# Patient Record
Sex: Male | Born: 1945
Health system: Southern US, Community
[De-identification: ages and names within clinical notes are randomized; demographics above are authoritative.]

## PROBLEM LIST (undated history)

## (undated) DIAGNOSIS — L309 Dermatitis, unspecified: Secondary | ICD-10-CM

## (undated) DIAGNOSIS — D494 Neoplasm of unspecified behavior of bladder: Secondary | ICD-10-CM

## (undated) DIAGNOSIS — K219 Gastro-esophageal reflux disease without esophagitis: Secondary | ICD-10-CM

## (undated) DIAGNOSIS — F411 Generalized anxiety disorder: Secondary | ICD-10-CM

## (undated) DIAGNOSIS — N35919 Unspecified urethral stricture, male, unspecified site: Secondary | ICD-10-CM

## (undated) DIAGNOSIS — Z8659 Personal history of other mental and behavioral disorders: Secondary | ICD-10-CM

## (undated) DIAGNOSIS — F419 Anxiety disorder, unspecified: Secondary | ICD-10-CM

## (undated) DIAGNOSIS — Z8551 Personal history of malignant neoplasm of bladder: Secondary | ICD-10-CM

## (undated) DIAGNOSIS — Z872 Personal history of diseases of the skin and subcutaneous tissue: Secondary | ICD-10-CM

## (undated) HISTORY — PX: TRANSURETHRAL RESECTION OF BLADDER TUMOR: SHX2575

## (undated) HISTORY — PX: CATARACT EXTRACTION W/ INTRAOCULAR LENS  IMPLANT, BILATERAL: SHX1307

---

## 1991-04-28 HISTORY — PX: APPENDECTOMY: SHX54

## 2016-11-12 ENCOUNTER — Emergency Department (HOSPITAL_COMMUNITY)
Admission: EM | Admit: 2016-11-12 | Discharge: 2016-11-12 | Disposition: A | Discharge status: Home / Self Care | Payer: Medicaid Other | Attending: Emergency Medicine | Admitting: Emergency Medicine

## 2016-11-12 ENCOUNTER — Emergency Department (HOSPITAL_COMMUNITY): Payer: Medicaid Other

## 2016-11-12 DIAGNOSIS — N3289 Other specified disorders of bladder: Secondary | ICD-10-CM | POA: Diagnosis not present

## 2016-11-12 DIAGNOSIS — R1030 Lower abdominal pain, unspecified: Secondary | ICD-10-CM | POA: Diagnosis present

## 2016-11-12 DIAGNOSIS — R109 Unspecified abdominal pain: Secondary | ICD-10-CM

## 2016-11-12 LAB — URINALYSIS, ROUTINE W REFLEX MICROSCOPIC
Bilirubin Urine: NEGATIVE
GLUCOSE, UA: NEGATIVE mg/dL
KETONES UR: NEGATIVE mg/dL
LEUKOCYTES UA: NEGATIVE
Nitrite: NEGATIVE
PH: 7 (ref 5.0–8.0)
PROTEIN: NEGATIVE mg/dL
Specific Gravity, Urine: 1.004 — ABNORMAL LOW (ref 1.005–1.030)

## 2016-11-12 LAB — COMPREHENSIVE METABOLIC PANEL
ALK PHOS: 78 U/L (ref 38–126)
ALT: 23 U/L (ref 17–63)
AST: 22 U/L (ref 15–41)
Albumin: 4.6 g/dL (ref 3.5–5.0)
Anion gap: 7 (ref 5–15)
BILIRUBIN TOTAL: 0.8 mg/dL (ref 0.3–1.2)
BUN: 17 mg/dL (ref 6–20)
CALCIUM: 9.2 mg/dL (ref 8.9–10.3)
CO2: 28 mmol/L (ref 22–32)
CREATININE: 0.86 mg/dL (ref 0.61–1.24)
Chloride: 103 mmol/L (ref 101–111)
Glucose, Bld: 102 mg/dL — ABNORMAL HIGH (ref 65–99)
Potassium: 4.1 mmol/L (ref 3.5–5.1)
Sodium: 138 mmol/L (ref 135–145)
TOTAL PROTEIN: 7.4 g/dL (ref 6.5–8.1)

## 2016-11-12 LAB — CBC
HCT: 44.5 % (ref 39.0–52.0)
Hemoglobin: 15.3 g/dL (ref 13.0–17.0)
MCH: 29 pg (ref 26.0–34.0)
MCHC: 34.4 g/dL (ref 30.0–36.0)
MCV: 84.4 fL (ref 78.0–100.0)
PLATELETS: 238 10*3/uL (ref 150–400)
RBC: 5.27 MIL/uL (ref 4.22–5.81)
RDW: 12.8 % (ref 11.5–15.5)
WBC: 6.9 10*3/uL (ref 4.0–10.5)

## 2016-11-12 MED ORDER — IOPAMIDOL (ISOVUE-300) INJECTION 61%
INTRAVENOUS | Status: AC
  Administered 2016-11-12: 100 mL via INTRAVENOUS
  Filled 2016-11-12: qty 100

## 2016-11-12 MED ORDER — IOPAMIDOL (ISOVUE-300) INJECTION 61%
100.0000 mL | Freq: Once | INTRAVENOUS | Status: AC | PRN
  Administered 2016-11-12: 100 mL via INTRAVENOUS

## 2016-11-12 NOTE — ED Notes (Signed)
Bed: [facility]PT1 Expected date:  Expected time:  Means of arrival:  Comments: 

## 2016-11-12 NOTE — Discharge Instructions (Signed)
Follow up with urology

## 2016-11-12 NOTE — ED Notes (Signed)
Pt had drawn In triage: Blue Gold Lavender Lt green  Dark green

## 2016-11-12 NOTE — ED Notes (Signed)
Pt refused foley catheter, pt able to urine

## 2016-11-12 NOTE — ED Provider Notes (Signed)
[facility] DEPT Provider Note   CSN: [id number] Arrival date & time: 11/12/16  1355     History   Chief Complaint Chief Complaint  Patient presents with  . Abdominal Pain   Patient is not speaking primarily of translated by his daughter. Medical translator offered and refused by family. HPI Ralph Rodgers is a 71 y.o. male.  HPI Patient presents with Lower abdominal pain for the last few days. Some urinary hesitancy. No fevers. No hematuria. Still able to urinate. Getting up frequently at night. Had a bladder tumor removed in 2006. Reportedly it was a mass. Family states it wasn't cancerous but he [place]. [place] States she had a follow-up around 8 months ago. There were unable to do a cystoscopy but reportedly had an ultrasound that did not show a mass. No diarrhea or constipation. No unintentional weight loss. Does not have a urologist here. No past medical history on file.  There are no active problems to display for this patient.   No past surgical history on file.     Home Medications    Prior to Admission medications   Medication Sig Start Date End Date Taking? Authorizing Provider  acetaminophen (TYLENOL) 500 MG tablet Take 500 mg by mouth every 6 (six) hours as needed for mild pain.   Yes [provider]  ALPRAZolam Duanne Moron) 0.5 MG tablet Take 0.5 mg by mouth daily as needed for anxiety.   Yes [provider]  amoxicillin (AMOXIL) 500 MG capsule Take 500 mg by mouth daily.   Yes [provider]  cetirizine (ZYRTEC) 10 MG tablet Take 10 mg by mouth daily.   Yes [provider]  ibuprofen (ADVIL,MOTRIN) 200 MG tablet Take 400 mg by mouth every 6 (six) hours as needed for moderate pain.   Yes [provider]  omeprazole (PRILOSEC) 20 MG capsule Take 20 mg by mouth daily as needed.   Yes [provider]    Family History No family history on file.  Social History Social History    Substance Use Topics  . Smoking status: Not on file  . Smokeless tobacco: Not on file  . Alcohol use Not on file     Allergies   Other   Review of Systems Review of Systems  Constitutional: Negative for appetite change.  HENT: Negative for congestion.   Respiratory: Negative for shortness of breath.   Cardiovascular: Negative for chest pain.  Gastrointestinal: Positive for abdominal pain.  Genitourinary: Positive for difficulty urinating. Negative for flank pain, hematuria, penile pain and testicular pain.  Musculoskeletal: Negative for back pain.  Skin: Negative for color change.  Neurological: Negative for numbness.  Hematological: Negative for adenopathy.  Psychiatric/Behavioral: Negative for confusion.     Physical Exam Updated Vital Signs BP 132/68   Pulse 80   Temp 98.3 F (36.8 C) (Oral)   Resp 18   Ht 5\' 9"  (1.753 m)   Wt 86.2 kg (190 lb)   SpO2 100%   BMI 28.06 kg/m   Physical Exam  Constitutional: He appears well-developed.  HENT:  Head: Atraumatic.  Eyes: Pupils are equal, round, and reactive to light.  Neck: Neck supple.  Cardiovascular: Normal rate.   Pulmonary/Chest: Effort normal.  Abdominal: There is tenderness.  Suprapubic tenderness and fullness.  Genitourinary: Penis normal.  Musculoskeletal: He exhibits no edema.  Neurological: He is alert.  Skin: Skin is warm. Capillary refill takes less than 2 seconds.  Psychiatric: He has a normal mood and  affect.     ED Treatments / Results  Labs (all labs ordered are listed, but only abnormal results are displayed) Labs Reviewed  COMPREHENSIVE METABOLIC PANEL - Abnormal; Notable for the following:       Result Value   Glucose, Bld 102 (*)    All other components within normal limits  URINALYSIS, ROUTINE W REFLEX MICROSCOPIC - Abnormal; Notable for the following:    Color, Urine STRAW (*)    Specific Gravity, Urine 1.004 (*)    Hgb urine dipstick MODERATE (*)    Bacteria, UA RARE (*)     Squamous Epithelial / LPF 0-5 (*)    All other components within normal limits  CBC    EKG  EKG Interpretation None       Radiology Ct Abdomen Pelvis W Contrast  Result Date: 11/12/2016 CLINICAL DATA:  Lower abdominal pain, pressure with urination. Patient reports having had a mass removed from his bladder in 2006. EXAM: CT ABDOMEN AND PELVIS WITH CONTRAST TECHNIQUE: Multidetector CT imaging of the abdomen and pelvis was performed using the standard protocol following bolus administration of intravenous contrast. CONTRAST:  134mL ISOVUE-300 IOPAMIDOL (ISOVUE-300) INJECTION 61% COMPARISON:  None. FINDINGS: Lower chest: Normal sized heart with coronary arteriosclerosis and aortic atherosclerosis. No acute pulmonary disease. Hepatobiliary: Hepatic steatosis. Unremarkable gallbladder. No biliary dilatation or focal mass. Pancreas: Unremarkable. No pancreatic ductal dilatation or surrounding inflammatory changes. Spleen: Normal in size without focal abnormality. Adjacent small splenule. Adrenals/Urinary Tract: Hyperdense mucosal lesion along the left lateral wall measuring 14 x 10 x 14 mm. Direct visual correlation recommended. Differential possibilities may include a new transitional cell lesion, residual from prior reported bladder lesion, polyp or focal chronic inflammation among some possibilities. No nephrolithiasis or obstructive uropathy. Simple interpolar lower left renal cyst measuring 2.5 x 3 x 2.9 cm. Normal bilateral adrenal glands. Stomach/Bowel: Stomach is within normal limits. Appendix not confidently identified however no pericecal or right lower quadrant inflammation is identified to suggest an acute appendicitis. No evidence of bowel wall thickening, distention, or inflammatory changes. Vascular/Lymphatic: Minimal aortoiliac atherosclerosis without aneurysm. No lymphadenopathy. Reproductive: Top-normal size prostate with normal appearing seminal vesicles. Other: No free air nor free  fluid. Musculoskeletal: Mild degenerative change along the lower thoracic spine. No acute nor suspicious osseous abnormalities. IMPRESSION: 1. Intraluminal left lateral wall lesion measuring 14 x 10 x 14 mm for which direct visual correlation is recommended in the absence of prior studies for comparison. The patient reports having had a previous bladder mass removed in 2006. Cannot exclude the possibility of a recurrence, new intraluminal bladder mass, post chronic surgical change or focal inflammation. 2. Simple left interpolar renal cyst measuring 2.5 x 3 x 2.9 cm. 3. Coronary arteriosclerosis and aortic atherosclerosis. Electronically Signed   By: Ashley Royalty M.D.   On: 11/12/2016 19:39    Procedures Procedures (including critical care time)  Medications Ordered in ED Medications  iopamidol (ISOVUE-300) 61 % injection 100 mL (100 mLs Intravenous Contrast Given 11/12/16 1919)     Initial Impression / Assessment and Plan / ED Course  I have reviewed the triage vital signs and the nursing notes.  Pertinent labs & imaging results that were available during my care of the patient were reviewed by me and considered in my medical decision making (see chart for details).     Patient with lower abdominal pain. Previous bladder tumor. Initially had some urinary retention but was later able to void spontaneously without retention. CT scan done due to  previous mass. Shows either bladder mass or scar tissue. Will have follow with urology. Discharge home.  Final Clinical Impressions(s) / ED Diagnoses   Final diagnoses:  Abdominal pain, unspecified abdominal location  Bladder mass    New Prescriptions Discharge Medication List as of 11/12/2016  8:26 PM       Davonna Belling, MD 11/13/16 [phone]

## 2016-11-12 NOTE — ED Triage Notes (Signed)
Pt from home with c/o lower abdominal pain that he rates 8/10. Pt describes pressure with urination but is able to urinate. Pt states he had a mass removed from his bladder in 2006. Pt denies n/v/d

## 2016-12-08 ENCOUNTER — Other Ambulatory Visit: Payer: Self-pay | Admitting: Urology

## 2016-12-23 ENCOUNTER — Encounter (HOSPITAL_COMMUNITY): Payer: Self-pay

## 2016-12-24 NOTE — Patient Instructions (Signed)
Ralph Rodgers  12/24/2016   Your procedure is scheduled on: 01-11-17  Report to [facility] Main  Entrance Take [place]  elevators to 3rd floor to  [place] at 530AM.   Call this number if you have problems the morning of surgery [phone]    Remember: ONLY 1 PERSON MAY GO WITH YOU TO SHORT STAY TO GET  READY MORNING OF YOUR SURGERY.  Do not eat food or drink liquids :After Midnight.     Take these medicines the morning of surgery with A SIP OF WATER: tylenol if needed, xanax if needed, cetirizine(zyrtec), omeprazole(prilosec) if needed                                You may not have any metal on your body including hair pins and              piercings  Do not wear jewelry, make-up, lotions, powders or perfumes, deodorant              Men may shave face and neck.   Do not bring valuables to the hospital. [place].  Contacts, dentures or bridgework may not be worn into surgery.      Patients discharged the day of surgery will not be allowed to drive home.  Name and phone number of your driver:  Special Instructions: N/A              Please read over the following fact sheets you were given: _____________________________________________________________________    [facility] - Preparing for Surgery Before surgery, you can play an important role.  Because skin is not sterile, your skin needs to be as free of germs as possible.  You can reduce the number of germs on your skin by washing with CHG (chlorahexidine gluconate) soap before surgery.  CHG is an antiseptic cleaner which kills germs and bonds with the skin to continue killing germs even after washing. Please DO NOT use if you have an allergy to CHG or antibacterial soaps.  If your skin becomes reddened/irritated stop using the CHG and inform your nurse when you arrive at Short Stay. Do not shave (including legs and underarms) for at least 48  hours prior to the first CHG shower.  You may shave your face/neck. Please follow these instructions carefully:  1.  Shower with CHG Soap the night before surgery and the  morning of Surgery.  2.  If you choose to wash your hair, wash your hair first as usual with your  normal  shampoo.  3.  After you shampoo, rinse your hair and body thoroughly to remove the  shampoo.                           4.  Use CHG as you would any other liquid soap.  You can apply chg directly  to the skin and wash                       Gently with a scrungie or clean washcloth.  5.  Apply the CHG Soap to your body ONLY FROM THE NECK DOWN.   Do not use on face/ open  Wound or open sores. Avoid contact with eyes, ears mouth and genitals (private parts).                       Wash face,  Genitals (private parts) with your normal soap.             6.  Wash thoroughly, paying special attention to the area where your surgery  will be performed.  7.  Thoroughly rinse your body with warm water from the neck down.  8.  DO NOT shower/wash with your normal soap after using and rinsing off  the CHG Soap.                9.  Pat yourself dry with a clean towel.            10.  Wear clean pajamas.            11.  Place clean sheets on your bed the night of your first shower and do not  sleep with pets. Day of Surgery : Do not apply any lotions/deodorants the morning of surgery.  Please wear clean clothes to the hospital/surgery center.  FAILURE TO FOLLOW THESE INSTRUCTIONS MAY RESULT IN THE CANCELLATION OF YOUR SURGERY PATIENT SIGNATURE_________________________________  NURSE SIGNATURE__________________________________  ________________________________________________________________________

## 2017-01-04 ENCOUNTER — Inpatient Hospital Stay (HOSPITAL_COMMUNITY)
Admission: RE | Admit: 2017-01-04 | Discharge: 2017-01-04 | Disposition: A | Discharge status: Home / Self Care | Payer: Self-pay | Source: Ambulatory Visit

## 2017-01-04 HISTORY — DX: Anxiety disorder, unspecified: F41.9

## 2017-01-04 HISTORY — DX: Gastro-esophageal reflux disease without esophagitis: K21.9

## 2017-01-11 ENCOUNTER — Encounter (INDEPENDENT_AMBULATORY_CARE_PROVIDER_SITE_OTHER): Payer: Self-pay

## 2017-01-11 ENCOUNTER — Encounter (HOSPITAL_COMMUNITY): Payer: Self-pay

## 2017-01-11 ENCOUNTER — Encounter (HOSPITAL_COMMUNITY)
Admission: RE | Admit: 2017-01-11 | Discharge: 2017-01-11 | Disposition: A | Discharge status: Home / Self Care | Payer: Medicaid Other | Source: Ambulatory Visit | Attending: Urology | Admitting: Urology

## 2017-01-11 DIAGNOSIS — Z01818 Encounter for other preprocedural examination: Secondary | ICD-10-CM | POA: Diagnosis present

## 2017-01-11 DIAGNOSIS — N359 Urethral stricture, unspecified: Secondary | ICD-10-CM | POA: Diagnosis not present

## 2017-01-11 DIAGNOSIS — N329 Bladder disorder, unspecified: Secondary | ICD-10-CM | POA: Insufficient documentation

## 2017-01-11 LAB — TYPE AND SCREEN
ABO/RH(D): B POS
Antibody Screen: NEGATIVE

## 2017-01-11 LAB — BASIC METABOLIC PANEL
Anion gap: 9 (ref 5–15)
BUN: 14 mg/dL (ref 6–20)
CO2: 26 mmol/L (ref 22–32)
CREATININE: 0.88 mg/dL (ref 0.61–1.24)
Calcium: 9.2 mg/dL (ref 8.9–10.3)
Chloride: 104 mmol/L (ref 101–111)
GFR calc Af Amer: >60 mL/min (ref >60)
GFR calc non Af Amer: >60 mL/min (ref >60)
GLUCOSE: 105 mg/dL — AB (ref 65–99)
Potassium: 4.2 mmol/L (ref 3.5–5.1)
SODIUM: 139 mmol/L (ref 135–145)

## 2017-01-11 LAB — APTT: aPTT: 33 seconds (ref 24–36)

## 2017-01-11 LAB — ABO/RH: ABO/RH(D): B POS

## 2017-01-11 LAB — CBC
HEMATOCRIT: 43.8 % (ref 39.0–52.0)
Hemoglobin: 14.6 g/dL (ref 13.0–17.0)
MCH: 29.4 pg (ref 26.0–34.0)
MCHC: 33.3 g/dL (ref 30.0–36.0)
MCV: 88.3 fL (ref 78.0–100.0)
PLATELETS: 248 10*3/uL (ref 150–400)
RBC: 4.96 MIL/uL (ref 4.22–5.81)
RDW: 13.5 % (ref 11.5–15.5)
WBC: 5.9 10*3/uL (ref 4.0–10.5)

## 2017-01-11 LAB — PROTIME-INR
INR: 1.03
Prothrombin Time: 13.4 seconds (ref 11.4–15.2)

## 2017-01-11 NOTE — Patient Instructions (Addendum)
Ralph Rodgers  01/11/2017   Your procedure is scheduled on: 01/22/17  Report to [facility] Main  Entrance Take [place]  elevators to 3rd floor to  [place] at      845 AM.    Call this number if you have problems the morning of surgery [phone]   Remember: ONLY 1 PERSON MAY GO WITH YOU TO SHORT STAY TO GET  READY MORNING OF [place].  Do not eat food or drink liquids :After Midnight.     Take these medicines the morning of surgery with A SIP OF WATER: Amoxicillin, Zyrtec, celexa, omeprazole if needed                                You may not have any metal on your body including hair pins and              piercings  Do not wear jewelry, lotions, powders or perfumes, deodorant                       Men may shave face and neck.   Do not bring valuables to the hospital. [place].  Contacts, dentures or bridgework may not be worn into surgery.      Patients discharged the day of surgery will not be allowed to drive home.  Name and phone number of your driver:  Special Instructions: N/A              Please read over the following fact sheets you were given: _____________________________________________________________________          [facility] - Preparing for Surgery Before surgery, you can play an important role.  Because skin is not sterile, your skin needs to be as free of germs as possible.  You can reduce the number of germs on your skin by washing with CHG (chlorahexidine gluconate) soap before surgery.  CHG is an antiseptic cleaner which kills germs and bonds with the skin to continue killing germs even after washing. Please DO NOT use if you have an allergy to CHG or antibacterial soaps.  If your skin becomes reddened/irritated stop using the CHG and inform your nurse when you arrive at Short Stay. Do not shave (including legs and underarms) for at least 48 hours prior to the first CHG  shower.  You may shave your face/neck. Please follow these instructions carefully:  1.  Shower with CHG Soap the night before surgery and the  morning of Surgery.  2.  If you choose to wash your hair, wash your hair first as usual with your  normal  shampoo.  3.  After you shampoo, rinse your hair and body thoroughly to remove the  shampoo.                           4.  Use CHG as you would any other liquid soap.  You can apply chg directly  to the skin and wash                       Gently with a scrungie or clean washcloth.  5.  Apply the CHG Soap  to your body ONLY FROM THE NECK DOWN.   Do not use on face/ open                           Wound or open sores. Avoid contact with eyes, ears mouth and genitals (private parts).                       Wash face,  Genitals (private parts) with your normal soap.             6.  Wash thoroughly, paying special attention to the area where your surgery  will be performed.  7.  Thoroughly rinse your body with warm water from the neck down.  8.  DO NOT shower/wash with your normal soap after using and rinsing off  the CHG Soap.                9.  Pat yourself dry with a clean towel.            10.  Wear clean pajamas.            11.  Place clean sheets on your bed the night of your first shower and do not  sleep with pets. Day of Surgery : Do not apply any lotions/deodorants the morning of surgery.  Please wear clean clothes to the hospital/surgery center.  FAILURE TO FOLLOW THESE INSTRUCTIONS MAY RESULT IN THE CANCELLATION OF YOUR SURGERY PATIENT SIGNATURE_________________________________  NURSE SIGNATURE__________________________________  ________________________________________________________________________  WHAT IS A BLOOD TRANSFUSION? Blood Transfusion Information  A transfusion is the replacement of blood or some of its parts. Blood is made up of multiple cells which provide different functions.  Red blood cells carry oxygen and are used for  blood loss replacement.  White blood cells fight against infection.  Platelets control bleeding.  Plasma helps clot blood.  Other blood products are available for specialized needs, such as hemophilia or other clotting disorders. BEFORE THE TRANSFUSION  Who gives blood for transfusions?   Healthy volunteers who are fully evaluated to make sure their blood is safe. This is blood bank blood. Transfusion therapy is the safest it has ever been in the practice of medicine. Before blood is taken from a donor, a complete history is taken to make sure that person has no history of diseases nor engages in risky social behavior (examples are intravenous drug use or sexual activity with multiple partners). The donor's travel history is screened to minimize risk of transmitting infections, such as malaria. The donated blood is tested for signs of infectious diseases, such as HIV and hepatitis. The blood is then tested to be sure it is compatible with you in order to minimize the chance of a transfusion reaction. If you or a relative donates blood, this is often done in anticipation of surgery and is not appropriate for emergency situations. It takes many days to process the donated blood. RISKS AND COMPLICATIONS Although transfusion therapy is very safe and saves many lives, the main dangers of transfusion include:   Getting an infectious disease.  Developing a transfusion reaction. This is an allergic reaction to something in the blood you were given. Every precaution is taken to prevent this. The decision to have a blood transfusion has been considered carefully by your caregiver before blood is given. Blood is not given unless the benefits outweigh the risks. AFTER THE TRANSFUSION  Right after receiving a blood transfusion, you will  usually feel much better and more energetic. This is especially true if your red blood cells have gotten low (anemic). The transfusion raises the level of the red blood  cells which carry oxygen, and this usually causes an energy increase.  The nurse administering the transfusion will monitor you carefully for complications. HOME CARE INSTRUCTIONS  No special instructions are needed after a transfusion. You may find your energy is better. Speak with your caregiver about any limitations on activity for underlying diseases you may have. SEEK MEDICAL CARE IF:   Your condition is not improving after your transfusion.  You develop redness or irritation at the intravenous (IV) site. SEEK IMMEDIATE MEDICAL CARE IF:  Any of the following symptoms occur over the next 12 hours:  Shaking chills.  You have a temperature by mouth above 102 F (38.9 C), not controlled by medicine.  Chest, back, or muscle pain.  People around you feel you are not acting correctly or are confused.  Shortness of breath or difficulty breathing.  Dizziness and fainting.  You get a rash or develop hives.  You have a decrease in urine output.  Your urine turns a dark color or changes to pink, red, or brown. Any of the following symptoms occur over the next 10 days:  You have a temperature by mouth above 102 F (38.9 C), not controlled by medicine.  Shortness of breath.  Weakness after normal activity.  The white part of the eye turns yellow (jaundice).  You have a decrease in the amount of urine or are urinating less often.  Your urine turns a dark color or changes to pink, red, or brown. Document Released: 04/10/2000 Document Revised: 07/06/2011 Document Reviewed: 11/28/2007 [facility] Patient Information 2014 [place], [place].  _______________________________________________________________________

## 2017-01-22 ENCOUNTER — Ambulatory Visit (HOSPITAL_COMMUNITY): Payer: Medicaid Other | Admitting: Certified Registered Nurse Anesthetist

## 2017-01-22 ENCOUNTER — Ambulatory Visit (HOSPITAL_COMMUNITY)
Admission: RE | Admit: 2017-01-22 | Discharge: 2017-01-22 | Disposition: A | Discharge status: Home / Self Care | Payer: Medicaid Other | Source: Ambulatory Visit | Attending: Urology | Admitting: Urology

## 2017-01-22 ENCOUNTER — Encounter (HOSPITAL_COMMUNITY)
Admission: RE | Disposition: A | Discharge status: Home / Self Care | Payer: Self-pay | Source: Ambulatory Visit | Attending: Urology

## 2017-01-22 ENCOUNTER — Encounter (HOSPITAL_COMMUNITY): Payer: Self-pay | Admitting: Emergency Medicine

## 2017-01-22 ENCOUNTER — Ambulatory Visit (HOSPITAL_COMMUNITY): Payer: Medicaid Other

## 2017-01-22 DIAGNOSIS — C679 Malignant neoplasm of bladder, unspecified: Secondary | ICD-10-CM | POA: Diagnosis present

## 2017-01-22 DIAGNOSIS — Z8551 Personal history of malignant neoplasm of bladder: Secondary | ICD-10-CM | POA: Diagnosis not present

## 2017-01-22 DIAGNOSIS — Z91018 Allergy to other foods: Secondary | ICD-10-CM | POA: Insufficient documentation

## 2017-01-22 DIAGNOSIS — Z87891 Personal history of nicotine dependence: Secondary | ICD-10-CM | POA: Insufficient documentation

## 2017-01-22 DIAGNOSIS — Z79899 Other long term (current) drug therapy: Secondary | ICD-10-CM | POA: Insufficient documentation

## 2017-01-22 DIAGNOSIS — K219 Gastro-esophageal reflux disease without esophagitis: Secondary | ICD-10-CM | POA: Insufficient documentation

## 2017-01-22 DIAGNOSIS — N359 Urethral stricture, unspecified: Secondary | ICD-10-CM | POA: Diagnosis not present

## 2017-01-22 DIAGNOSIS — F410 Panic disorder [episodic paroxysmal anxiety] without agoraphobia: Secondary | ICD-10-CM | POA: Insufficient documentation

## 2017-01-22 DIAGNOSIS — C672 Malignant neoplasm of lateral wall of bladder: Secondary | ICD-10-CM | POA: Insufficient documentation

## 2017-01-22 HISTORY — PX: TRANSURETHRAL RESECTION OF BLADDER TUMOR: SHX2575

## 2017-01-22 HISTORY — PX: CYSTOSCOPY WITH URETHRAL DILATATION: SHX5125

## 2017-01-22 HISTORY — PX: CYSTOSCOPY W/ RETROGRADES: SHX1426

## 2017-01-22 SURGERY — TURBT (TRANSURETHRAL RESECTION OF BLADDER TUMOR)
Anesthesia: General | Site: Bladder

## 2017-01-22 MED ORDER — PROPOFOL 10 MG/ML IV BOLUS
INTRAVENOUS | Status: DC | PRN
  Administered 2017-01-22: 30 mg via INTRAVENOUS
  Administered 2017-01-22: 150 mg via INTRAVENOUS

## 2017-01-22 MED ORDER — SUGAMMADEX SODIUM 200 MG/2ML IV SOLN
INTRAVENOUS | Status: AC
  Filled 2017-01-22: qty 2

## 2017-01-22 MED ORDER — SODIUM CHLORIDE 0.9 % IR SOLN
Status: DC | PRN
  Administered 2017-01-22 (×2): 3000 mL

## 2017-01-22 MED ORDER — MEPERIDINE HCL 50 MG/ML IJ SOLN: 6.2500 mg | INTRAMUSCULAR | Status: DC | PRN

## 2017-01-22 MED ORDER — SUCCINYLCHOLINE CHLORIDE 200 MG/10ML IV SOSY
PREFILLED_SYRINGE | INTRAVENOUS | Status: AC
  Filled 2017-01-22: qty 10

## 2017-01-22 MED ORDER — PHENYLEPHRINE 40 MCG/ML (10ML) SYRINGE FOR IV PUSH (FOR BLOOD PRESSURE SUPPORT)
PREFILLED_SYRINGE | INTRAVENOUS | Status: AC
  Filled 2017-01-22: qty 10

## 2017-01-22 MED ORDER — MIDAZOLAM HCL 2 MG/2ML IJ SOLN
INTRAMUSCULAR | Status: AC
  Filled 2017-01-22: qty 2

## 2017-01-22 MED ORDER — ONDANSETRON HCL 4 MG/2ML IJ SOLN
INTRAMUSCULAR | Status: DC | PRN
  Administered 2017-01-22: 4 mg via INTRAVENOUS

## 2017-01-22 MED ORDER — PROMETHAZINE HCL 25 MG/ML IJ SOLN: 6.2500 mg | INTRAMUSCULAR | Status: DC | PRN

## 2017-01-22 MED ORDER — MITOMYCIN CHEMO FOR BLADDER INSTILLATION 40 MG
40.0000 mg | Freq: Once | INTRAVENOUS | Status: AC
  Administered 2017-01-22: 40 mg via INTRAVESICAL
  Filled 2017-01-22: qty 40

## 2017-01-22 MED ORDER — SUCCINYLCHOLINE CHLORIDE 200 MG/10ML IV SOSY
PREFILLED_SYRINGE | INTRAVENOUS | Status: DC | PRN
  Administered 2017-01-22: 100 mg via INTRAVENOUS

## 2017-01-22 MED ORDER — LACTATED RINGERS IV SOLN
INTRAVENOUS | Status: DC
  Administered 2017-01-22 (×2): via INTRAVENOUS

## 2017-01-22 MED ORDER — LIDOCAINE 2% (20 MG/ML) 5 ML SYRINGE
INTRAMUSCULAR | Status: DC | PRN
  Administered 2017-01-22: 80 mg via INTRAVENOUS

## 2017-01-22 MED ORDER — OXYCODONE HCL 5 MG/5ML PO SOLN: 5.0000 mg | Freq: Once | ORAL | Status: DC | PRN

## 2017-01-22 MED ORDER — MITOMYCIN CHEMO FOR BLADDER INSTILLATION 40 MG: 40.0000 mg | Freq: Once | INTRAVENOUS | Status: DC

## 2017-01-22 MED ORDER — HYDROMORPHONE HCL-NACL 0.5-0.9 MG/ML-% IV SOSY
0.2500 mg | PREFILLED_SYRINGE | INTRAVENOUS | Status: DC | PRN
  Administered 2017-01-22 (×2): 0.5 mg via INTRAVENOUS

## 2017-01-22 MED ORDER — OXYCODONE HCL 5 MG PO CAPS: 5.0000 mg | ORAL_CAPSULE | ORAL | 0 refills | Status: DC | PRN

## 2017-01-22 MED ORDER — LIDOCAINE 2% (20 MG/ML) 5 ML SYRINGE
INTRAMUSCULAR | Status: AC
  Filled 2017-01-22: qty 5

## 2017-01-22 MED ORDER — OXYCODONE HCL 5 MG PO TABS: 5.0000 mg | ORAL_TABLET | Freq: Once | ORAL | Status: DC | PRN

## 2017-01-22 MED ORDER — HYDROMORPHONE HCL-NACL 0.5-0.9 MG/ML-% IV SOSY
PREFILLED_SYRINGE | INTRAVENOUS | Status: AC
  Filled 2017-01-22: qty 4

## 2017-01-22 MED ORDER — PHENYLEPHRINE 40 MCG/ML (10ML) SYRINGE FOR IV PUSH (FOR BLOOD PRESSURE SUPPORT)
PREFILLED_SYRINGE | INTRAVENOUS | Status: DC | PRN
  Administered 2017-01-22: 80 ug via INTRAVENOUS

## 2017-01-22 MED ORDER — CEFAZOLIN SODIUM-DEXTROSE 2-4 GM/100ML-% IV SOLN
2.0000 g | INTRAVENOUS | Status: AC
  Administered 2017-01-22: 2 g via INTRAVENOUS
  Filled 2017-01-22: qty 100

## 2017-01-22 MED ORDER — FENTANYL CITRATE (PF) 100 MCG/2ML IJ SOLN
INTRAMUSCULAR | Status: DC | PRN
  Administered 2017-01-22: 100 ug via INTRAVENOUS

## 2017-01-22 MED ORDER — ROCURONIUM BROMIDE 50 MG/5ML IV SOSY
PREFILLED_SYRINGE | INTRAVENOUS | Status: AC
  Filled 2017-01-22: qty 5

## 2017-01-22 MED ORDER — SUGAMMADEX SODIUM 200 MG/2ML IV SOLN
INTRAVENOUS | Status: DC | PRN
  Administered 2017-01-22: 200 mg via INTRAVENOUS

## 2017-01-22 MED ORDER — DOCUSATE SODIUM 100 MG PO CAPS: 100.0000 mg | ORAL_CAPSULE | Freq: Two times a day (BID) | ORAL | 0 refills | Status: AC

## 2017-01-22 MED ORDER — ROCURONIUM BROMIDE 10 MG/ML (PF) SYRINGE
PREFILLED_SYRINGE | INTRAVENOUS | Status: DC | PRN
Start: 2017-01-22 — End: 2017-01-22
  Administered 2017-01-22: 20 mg via INTRAVENOUS
  Administered 2017-01-22 (×2): 10 mg via INTRAVENOUS

## 2017-01-22 MED ORDER — PROPOFOL 10 MG/ML IV BOLUS
INTRAVENOUS | Status: AC
  Filled 2017-01-22: qty 20

## 2017-01-22 MED ORDER — IOHEXOL 300 MG/ML  SOLN
INTRAMUSCULAR | Status: DC | PRN
  Administered 2017-01-22: 10 mL via INTRAVENOUS

## 2017-01-22 MED ORDER — MIDAZOLAM HCL 5 MG/5ML IJ SOLN
INTRAMUSCULAR | Status: DC | PRN
  Administered 2017-01-22: 2 mg via INTRAVENOUS

## 2017-01-22 MED ORDER — SENNA 8.6 MG PO TABS: 1.0000 | ORAL_TABLET | Freq: Every day | ORAL | 0 refills | Status: AC

## 2017-01-22 MED ORDER — ONDANSETRON HCL 4 MG/2ML IJ SOLN
INTRAMUSCULAR | Status: AC
  Filled 2017-01-22: qty 2

## 2017-01-22 MED ORDER — FENTANYL CITRATE (PF) 100 MCG/2ML IJ SOLN
INTRAMUSCULAR | Status: AC
  Filled 2017-01-22: qty 2

## 2017-01-22 SURGICAL SUPPLY — 32 items
BAG URINE DRAINAGE (UROLOGICAL SUPPLIES) ×3 IMPLANT
BAG URO CATCHER STRL LF (MISCELLANEOUS) ×3 IMPLANT
BALLN NEPHROSTOMY (BALLOONS) ×3
BALLOON NEPHROSTOMY (BALLOONS) ×2 IMPLANT
BASKET DAKOTA 1.9FR 11X120 (BASKET) IMPLANT
BASKET ZERO TIP 1.9FR (BASKET) IMPLANT
BASKET ZERO TIP NITINOL 2.4FR (BASKET) IMPLANT
CATH FOLEY 2WAY SLVR  5CC 20FR (CATHETERS) ×1
CATH FOLEY 2WAY SLVR 5CC 20FR (CATHETERS) ×2 IMPLANT
CATH INTERMIT  6FR 70CM (CATHETERS) ×3 IMPLANT
CLOTH BEACON ORANGE TIMEOUT ST (SAFETY) IMPLANT
COVER FOOTSWITCH UNIV (MISCELLANEOUS) ×3 IMPLANT
COVER SURGICAL LIGHT HANDLE (MISCELLANEOUS) IMPLANT
ELECT REM PT RETURN 15FT ADLT (MISCELLANEOUS) IMPLANT
EVACUATOR MICROVAS BLADDER (UROLOGICAL SUPPLIES) ×3 IMPLANT
GLOVE BIOGEL M 8.0 STRL (GLOVE) ×3 IMPLANT
GOWN STRL REUS W/TWL XL LVL3 (GOWN DISPOSABLE) ×3 IMPLANT
GUIDEWIRE ANG ZIPWIRE 038X150 (WIRE) IMPLANT
GUIDEWIRE STR DUAL SENSOR (WIRE) ×3 IMPLANT
HOLDER FOLEY CATH W/STRAP (MISCELLANEOUS) ×3 IMPLANT
IV NS 1000ML (IV SOLUTION) ×1
IV NS 1000ML BAXH (IV SOLUTION) ×2 IMPLANT
LOOP CUT BIPOLAR 24F LRG (ELECTROSURGICAL) ×3 IMPLANT
MANIFOLD NEPTUNE II (INSTRUMENTS) ×3 IMPLANT
NS IRRIG 1000ML POUR BTL (IV SOLUTION) ×3 IMPLANT
PACK CYSTO (CUSTOM PROCEDURE TRAY) ×3 IMPLANT
SET ASPIRATION TUBING (TUBING) IMPLANT
SHEATH ACCESS URETERAL 24CM (SHEATH) IMPLANT
SHEATH ACCESS URETERAL 38CM (SHEATH) IMPLANT
SHEATH ACCESS URETERAL 54CM (SHEATH) IMPLANT
SYRINGE IRR TOOMEY STRL 70CC (SYRINGE) IMPLANT
TUBING CONNECTING 10 (TUBING) ×3 IMPLANT

## 2017-01-22 NOTE — Op Note (Signed)
Preoperative Diagnosis: Bladder cancer  Postoperative Diagnosis:  Same  Procedure(s) Performed:   - Cystourethroscopy - Balloon dilation of urethral stricture - Transurethral resection of medium bladder tumor - Bilateral retrograde pyelogram  Teaching Surgeon:  Kathie Rhodes, MD  Resident Surgeon:  Stasia Cavalier, MD  Assistant(s):  None  Anesthesia:  General  Fluids:  See anesthesia record  Estimated blood loss:  0cc  Specimens:  Stone for analysis  Drains:  18Fr straight Foley catheter  Complications:  None  Indications: 71 y.o. 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 He presents today for urethral dilation and TURBT. Risks & benefits of the procedure discussed with the patient, who wishes to proceed.  Findings:  6Fr soft stricture in the bulbar urethra, dilated to 24Fr with a balloon. 2.0cm left lateral wall tumor, resected down to muscle without perforation. UOs no involved. No other tumors noted. Bilateral RPG normal.   Description:  The patient was correctly identified in the preop holding area where written informed consent as well potential risk and complication reviewed. The patient agreed. They were brought back to the operative suite where a preinduction timeout was performed. Once correct information was verified, general anesthesia was induced. They were then gently placed into dorsal lithotomy position with SCDs in place for VTE prophylaxis. They were prepped and draped in the usual sterile fashion and given appropriate preoperative antibiotics. A second timeout was then performed.   We inserted a 59F rigid cystoscope per urethra with copious lubrication and normal saline irrigation running. This demonstrated findings as described above. We placed a wire over the stricture, and dilated the stricture to 24Fr with a balloon under fluoro.    Next, we obtained a resectoscope with which we performed thorough cystoscopy. We noted a lone 2.0cm tumor on the left posterolateral wall. We performed transurethral resection using bipolar electrocautery, with care not to perforate the bladder. We evacuated all tumor and also obtained a deep biopsy. We evacuated all tumor from the bladder, and noted excellent hemostasis with an empty bladder. We then placed a 18Fr straight catheter into the bladder for mitomycin C in PACU.  Post Op Plan:   1. [facility] in PACU.  2. Catheter to be removed prior to discharge.  3. Will call with pathology and schedule follow up accordingly.   Attestation:  Dr. Karsten Ro was present for the entire procedure.

## 2017-01-22 NOTE — Anesthesia Procedure Notes (Signed)
Procedure Name: Intubation Date/Time: 01/22/2017 10:57 AM Performed by: Claudia Desanctis Pre-anesthesia Checklist: Patient identified, Emergency Drugs available, Suction available and Patient being monitored Patient Re-evaluated:Patient Re-evaluated prior to induction Oxygen Delivery Method: Circle system utilized Preoxygenation: Pre-oxygenation with 100% oxygen Induction Type: IV induction Ventilation: Mask ventilation without difficulty Laryngoscope Size: 2 and Miller Grade View: Grade I Tube type: Oral Tube size: 7.5 mm Number of attempts: 1 Airway Equipment and Method: Stylet Placement Confirmation: ETT inserted through vocal cords under direct vision,  positive ETCO2 and breath sounds checked- equal and bilateral Secured at: 22 cm Tube secured with: Tape Dental Injury: Teeth and Oropharynx as per pre-operative assessment

## 2017-01-22 NOTE — Anesthesia Preprocedure Evaluation (Signed)
Anesthesia Evaluation  Patient identified by MRN, date of birth, ID band Patient awake    Reviewed: Allergy & Precautions, NPO status , Patient's Chart, lab work & pertinent test results  Airway Mallampati: II  TM Distance: >3 FB Neck ROM: Full    Dental no notable dental hx.    Pulmonary neg pulmonary ROS, former smoker,    Pulmonary exam normal breath sounds clear to auscultation       Cardiovascular negative cardio ROS Normal cardiovascular exam Rhythm:Regular Rate:Normal     Neuro/Psych Anxiety negative neurological ROS  negative psych ROS   GI/Hepatic negative GI ROS, Neg liver ROS, GERD  ,  Endo/Other  negative endocrine ROS  Renal/GU negative Renal ROS  negative genitourinary   Musculoskeletal negative musculoskeletal ROS (+)   Abdominal   Peds negative pediatric ROS (+)  Hematology negative hematology ROS (+)   Anesthesia Other Findings   Reproductive/Obstetrics negative OB ROS                             Anesthesia Physical Anesthesia Plan  ASA: II  Anesthesia Plan: General   Post-op Pain Management:    Induction: Intravenous  PONV Risk Score and Plan: 2 and Ondansetron and Midazolam  Airway Management Planned: LMA and Oral ETT  Additional Equipment:   Intra-op Plan:   Post-operative Plan: Extubation in OR  Informed Consent: I have reviewed the patients History and Physical, chart, labs and discussed the procedure including the risks, benefits and alternatives for the proposed anesthesia with the patient or authorized representative who has indicated his/her understanding and acceptance.   Dental advisory given  Plan Discussed with: CRNA  Anesthesia Plan Comments:         Anesthesia Quick Evaluation

## 2017-01-22 NOTE — Transfer of Care (Signed)
Immediate Anesthesia Transfer of Care Note  Patient: Ralph Rodgers  Procedure(s) Performed: Procedure(s): TRANSURETHRAL RESECTION OF BLADDER TUMOR (TURBT) (N/A) CYSTOSCOPY WITH URETHRAL DILATATION/ INSTILLATION OF MITOMYCIN C (N/A) CYSTOSCOPY WITH RETROGRADE PYELOGRAM (Bilateral)  Patient Location: PACU  Anesthesia Type:General  Level of Consciousness:  sedated, patient cooperative and responds to stimulation  Airway & Oxygen Therapy:Patient Spontanous Breathing and Patient connected to face mask oxgen  Post-op Assessment:  Report given to PACU RN and Post -op Vital signs reviewed and stable  Post vital signs:  Reviewed and stable  Last Vitals:  Vitals:   01/22/17 0812  BP: (!) 150/77  Pulse: 71  Resp: 16  Temp: 37 C  SpO2: 854%    Complications: No apparent anesthesia complications

## 2017-01-22 NOTE — Anesthesia Postprocedure Evaluation (Signed)
Anesthesia Post Note  Patient: Ralph Rodgers  Procedure(s) Performed: Procedure(s) (LRB): TRANSURETHRAL RESECTION OF BLADDER TUMOR (TURBT) (N/A) CYSTOSCOPY WITH URETHRAL DILATATION/ INSTILLATION OF MITOMYCIN C (N/A) CYSTOSCOPY WITH RETROGRADE PYELOGRAM (Bilateral)     Patient location during evaluation: PACU Anesthesia Type: General Level of consciousness: awake and alert Pain management: pain level controlled Vital Signs Assessment: post-procedure vital signs reviewed and stable Respiratory status: spontaneous breathing, nonlabored ventilation and respiratory function stable Cardiovascular status: blood pressure returned to baseline and stable Postop Assessment: no apparent nausea or vomiting Anesthetic complications: no    Last Vitals:  Vitals:   01/22/17 1349 01/22/17 1414  BP: (!) 126/57 (!) 121/92  Pulse: 64 67  Resp: (!) 22 16  Temp: 36.8 C 37 C  SpO2: 100% 94%    Last Pain:  Vitals:   01/22/17 1414  TempSrc: Oral                 Lynda Rainwater

## 2017-01-22 NOTE — H&P (Signed)
Urology H&P  Chief Complaint: Bladder cancer  History of Present Illness: 71 y.o. male with a history of bladder cancer dating from at least 2006 in [place]. S/p several resections, as well as an induction course of BCG. Was seen here recently for surveillance, and cysto revealed a 6Fr culbar stricture. CT revealed a left lateral wall massmeasuring about 1.5cm. He is here to undergo cysto, dilation of urethral stricture, and TURBT.  Past Medical History:  Diagnosis Date  . Anxiety    with panic attacks  . Folliculitis    Facial  . GERD (gastroesophageal reflux disease)    Past Surgical History:  Procedure Laterality Date  . APPENDECTOMY     1993  . bladder tumor removed     2006 non cancerous  . EYE SURGERY     bil cataracts removed  . immunotherapy sessions     04/2004 post bladder tumor surgery    Home Medications:  Prescriptions Prior to Admission  Medication Sig Dispense Refill Last Dose  . amoxicillin (AMOXIL) 500 MG capsule Take 500 mg by mouth daily.   01/22/2017 at Unknown time  . cetirizine (ZYRTEC) 10 MG tablet Take 10 mg by mouth daily.   01/22/2017 at Unknown time  . citalopram (CELEXA) 40 MG tablet Take 20-40 mg by mouth daily.   01/21/2017 at Unknown time  . omeprazole (PRILOSEC) 20 MG capsule Take 20 mg by mouth daily as needed.   More than a month at Unknown time   Allergies:  Allergies  Allergen Reactions  . Other Anaphylaxis    Pine nuts    History reviewed. No pertinent family history. Social History:  reports that he has quit smoking. He has never used smokeless tobacco. He reports that he does not drink alcohol or use drugs.  ROS  Physical Exam:  Vital signs in last 24 hours: Temp:  [98.6 F (37 C)] 98.6 F (37 C) (09/28 0812) Pulse Rate:  [71] 71 (09/28 0812) Resp:  [16] 16 (09/28 0812) BP: (150)/(77) 150/77 (09/28 0812) SpO2:  [100 %] 100 % (09/28 0812) Weight:  [85.7 kg (189 lb)] 85.7 kg (189 lb) (09/28 0836) Physical  Exam  Impression/Assessment:  71 y.o. male with NMIBC and a bulbar urethral stricture recently found to have a bladder mass on CT. Here for cysto, urethral dilation, and TURBT +/- [facility].   Plan:  Proceed to OR as planned.   Stasia Cavalier 01/22/2017, 10:29 AM   Patient was seen, examined,treatment plan was discussed with the resident.  I have directly reviewed the clinical findings, lab, imaging studies and management of this patient in detail. I have made the necessary changes and/or additions to the above noted documentation, and agree with the documentation, as recorded by the resident.

## 2017-01-22 NOTE — Progress Notes (Signed)
Bladder drained via foley and placed in isolation bag provided. NS, 100ML instilled into bladder and foley promptly removed per order of Dr. Jess Barters. Patient tolerated procedure very well

## 2017-01-22 NOTE — Discharge Instructions (Signed)
°  1. You may see some blood in the urine and may have some burning with urination for 48-72 hours. You also may notice that you have to urinate more frequently or urgently after your procedure which is normal.  2. You should call should you develop an inability urinate, fever > 101, persistent nausea and vomiting that prevents you from eating or drinking to stay hydrated.  3. If you have a stent, you will likely urinate more frequently and urgently until the stent is removed and you may experience some discomfort/pain in the lower abdomen and flank especially when urinating. You may take pain medication prescribed to you if needed for pain. You may also intermittently have blood in the urine until the stent is removed. 4. Call the office [phone]) to notify us of any issues. We will call you with your pathology results in 1 week.

## 2017-01-25 ENCOUNTER — Encounter (HOSPITAL_COMMUNITY): Payer: Self-pay | Admitting: Urology

## 2017-07-25 IMAGING — CT CT ABD-PELV W/ CM
2 of 5 series · 15 of 46 positions shown, 17 images · IV contrast (iopamidol)
Comparison: None.

CLINICAL DATA: Lower abdominal pain, pressure with urination.

EXAM:
CT ABDOMEN AND PELVIS WITH CONTRAST
TECHNIQUE: Multidetector CT imaging of the abdomen and pelvis was performed
using the standard protocol following bolus administration of
intravenous contrast.
CONTRAST:  100mL 1PPQE3-299 IOPAMIDOL (1PPQE3-299) INJECTION 61%

[Series 2: abd/pel with · axial · 0.84mm/px · z∈[+853,+1278]mm · 12 of 97 slices shown, 14 images]
[im 6/97  soft-tissue]
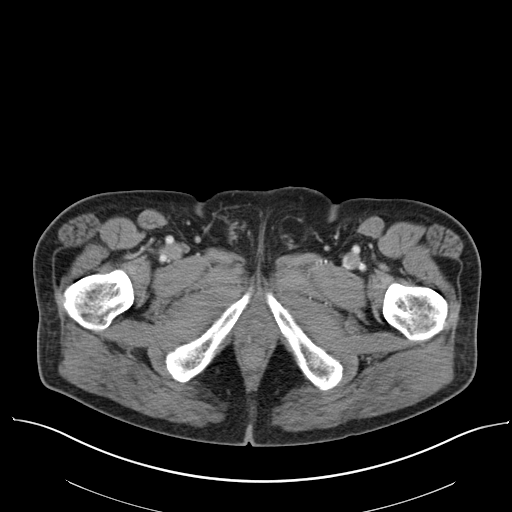
[im 6/97  bone]
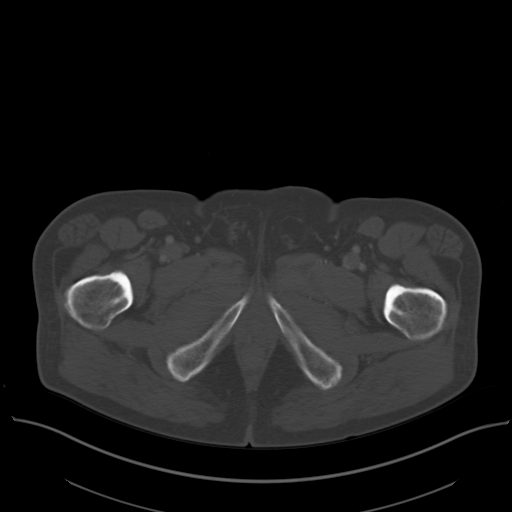
[im 17/97  soft-tissue]
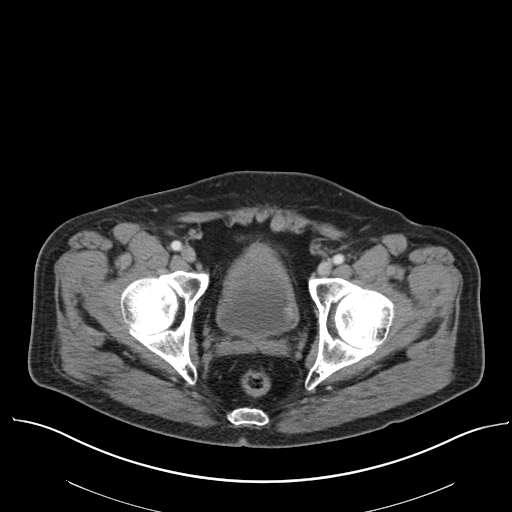
[im 23/97  soft-tissue]
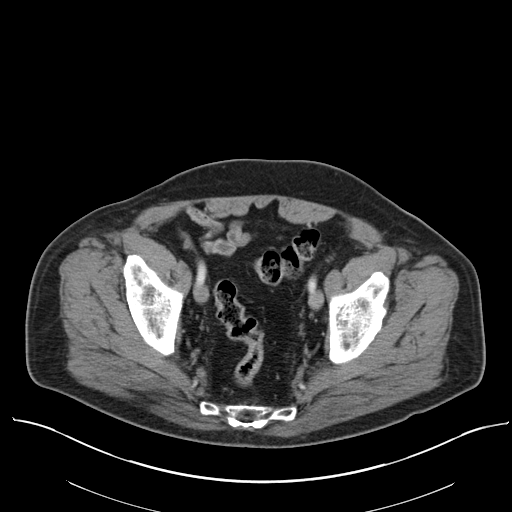
[im 29/97  soft-tissue]
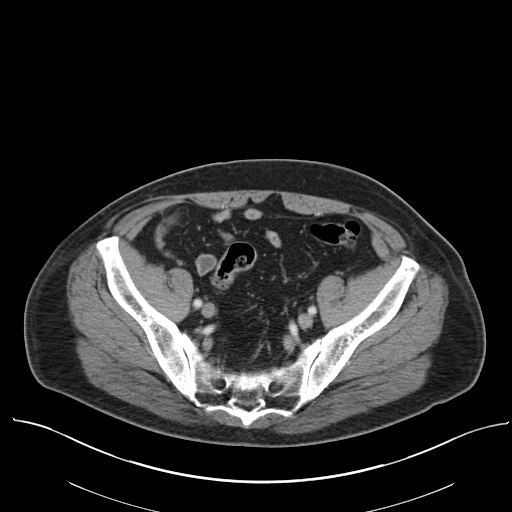
[im 40/97  soft-tissue]
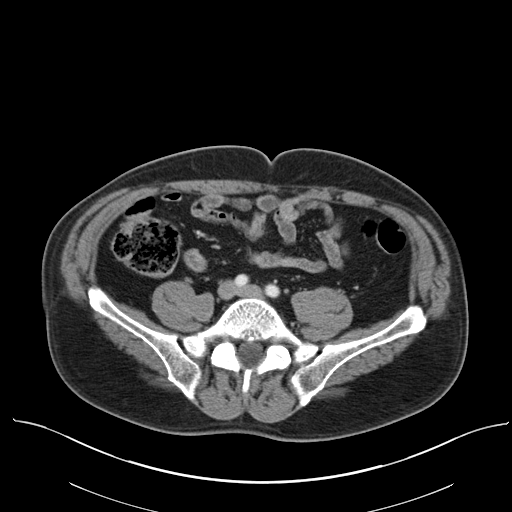
[im 46/97  soft-tissue]
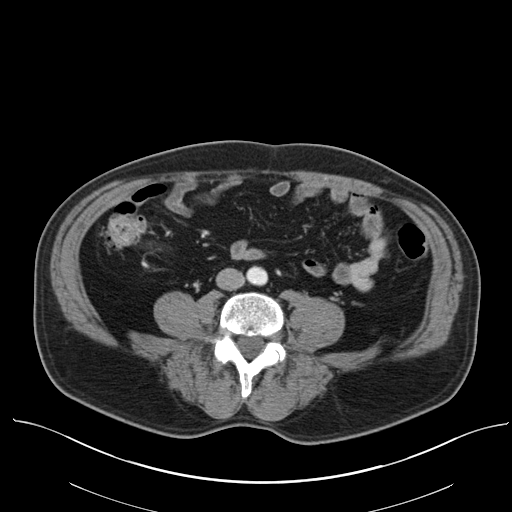
[im 51/97  soft-tissue]
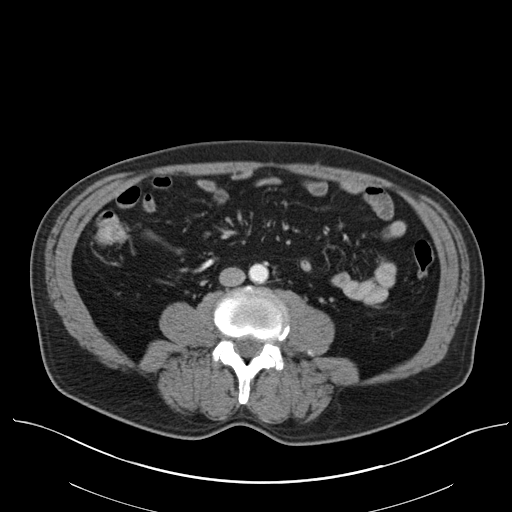
[im 63/97  soft-tissue]
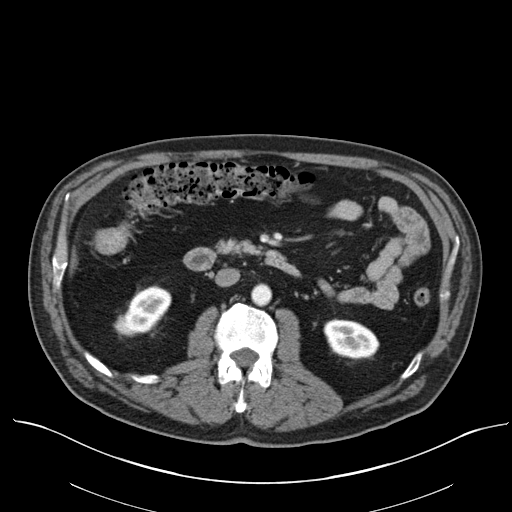
[im 68/97  soft-tissue]
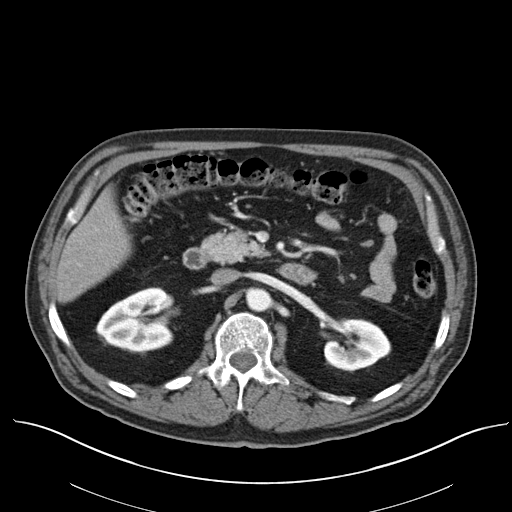
[im 68/97  bone]
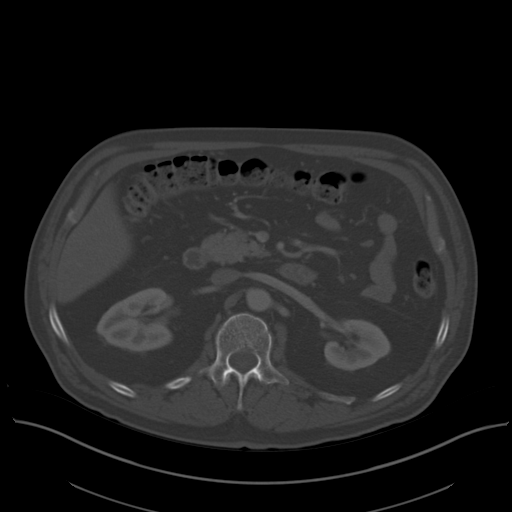
[im 74/97  soft-tissue]
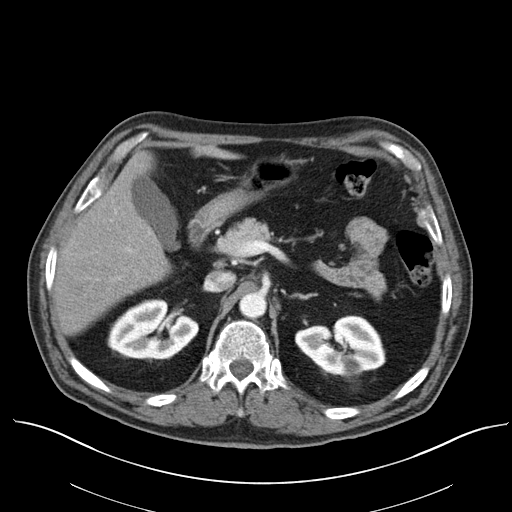
[im 85/97  soft-tissue]
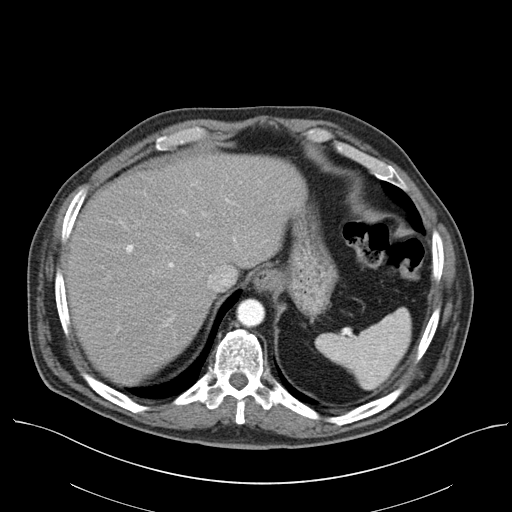
[im 91/97  soft-tissue]
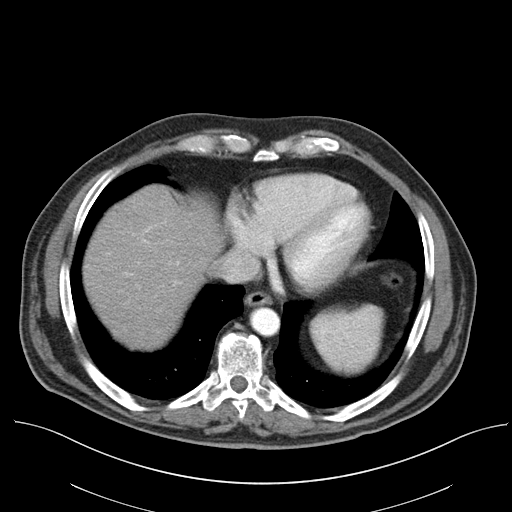

[Series 5: coronal a/|p · coronal · 0.74mm/px · 3 of 144 slices shown]
[im 48/144  soft-tissue]
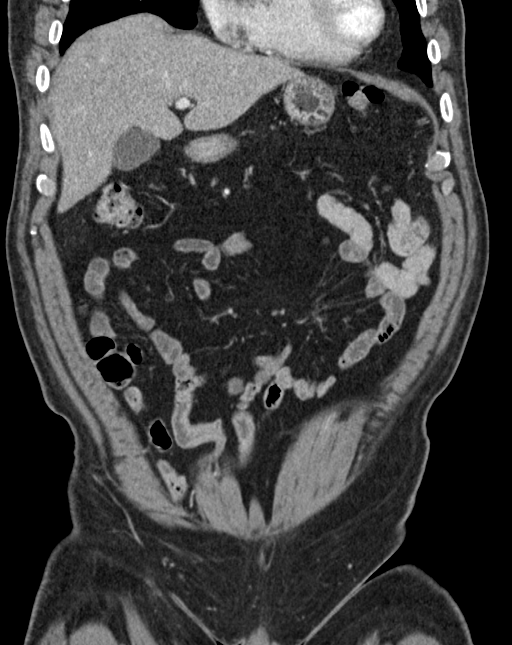
[im 64/144  soft-tissue]
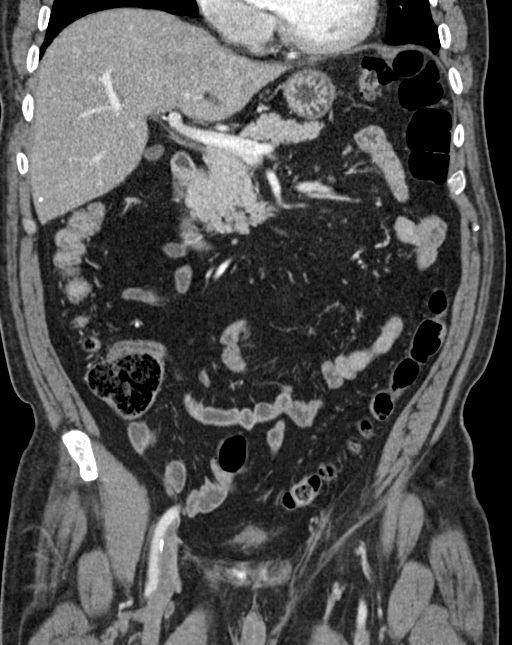
[im 80/144  soft-tissue]
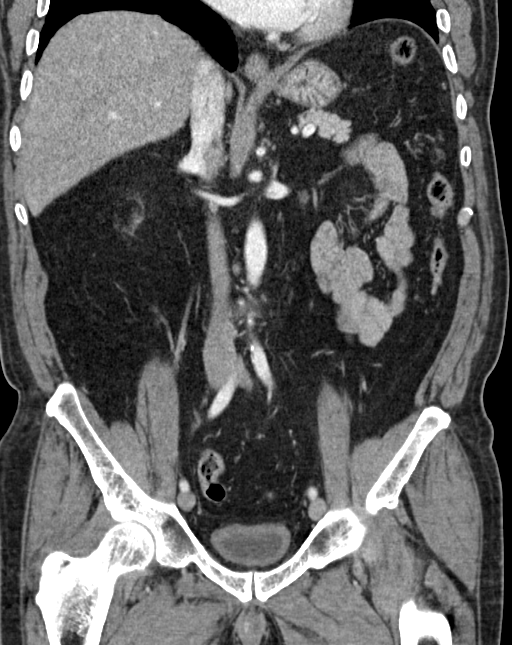

[15 of 46 positions shown; findings below may reference images not displayed]

FINDINGS: Lower chest: Normal sized heart with coronary arteriosclerosis and
aortic atherosclerosis. No acute pulmonary disease.

Hepatobiliary: Hepatic steatosis. Unremarkable gallbladder. No
biliary dilatation or focal mass.

Pancreas: Unremarkable. No pancreatic ductal dilatation or
surrounding inflammatory changes.

Spleen: Normal in size without focal abnormality. Adjacent small
splenule.

Adrenals/Urinary Tract: Hyperdense mucosal lesion along the left
lateral wall measuring 14 x 10 x 14 mm. Direct visual correlation
recommended. Differential possibilities may include a new
transitional cell lesion, residual from prior reported bladder
lesion, polyp or focal chronic inflammation among some
possibilities. No nephrolithiasis or obstructive uropathy. Simple
interpolar lower left renal cyst measuring 2.5 x 3 x 2.9 cm. Normal
bilateral adrenal glands.

Stomach/Bowel: Stomach is within normal limits. Appendix not
confidently identified however no pericecal or right lower quadrant
inflammation is identified to suggest an acute appendicitis. No
evidence of bowel wall thickening, distention, or inflammatory
changes.

Vascular/Lymphatic: Minimal aortoiliac atherosclerosis without
aneurysm. No lymphadenopathy.

Reproductive: Top-normal size prostate with normal appearing seminal
vesicles.

Other: No free air nor free fluid.

Musculoskeletal: Mild degenerative change along the lower thoracic
spine. No acute nor suspicious osseous abnormalities.
IMPRESSION: 1. Intraluminal left lateral wall lesion measuring 14 x 10 x 14 mm
for which direct visual correlation is recommended in the absence of
prior studies for comparison. The patient reports having had a
previous bladder mass removed in 7885. Cannot exclude the
possibility of a recurrence, new intraluminal bladder mass, post
chronic surgical change or focal inflammation.
2. Simple left interpolar renal cyst measuring 2.5 x 3 x 2.9 cm.
3. Coronary arteriosclerosis and aortic atherosclerosis.

## 2018-03-02 DIAGNOSIS — N35011 Post-traumatic bulbous urethral stricture: Secondary | ICD-10-CM | POA: Diagnosis not present

## 2018-03-02 DIAGNOSIS — C674 Malignant neoplasm of posterior wall of bladder: Secondary | ICD-10-CM | POA: Diagnosis not present

## 2018-03-04 DIAGNOSIS — L739 Follicular disorder, unspecified: Secondary | ICD-10-CM | POA: Diagnosis not present

## 2018-03-04 DIAGNOSIS — L309 Dermatitis, unspecified: Secondary | ICD-10-CM | POA: Diagnosis not present

## 2018-03-04 DIAGNOSIS — Z125 Encounter for screening for malignant neoplasm of prostate: Secondary | ICD-10-CM | POA: Diagnosis not present

## 2018-03-04 DIAGNOSIS — Z1211 Encounter for screening for malignant neoplasm of colon: Secondary | ICD-10-CM | POA: Diagnosis not present

## 2018-03-04 DIAGNOSIS — L719 Rosacea, unspecified: Secondary | ICD-10-CM | POA: Diagnosis not present

## 2018-03-04 DIAGNOSIS — F411 Generalized anxiety disorder: Secondary | ICD-10-CM | POA: Diagnosis not present

## 2018-03-04 DIAGNOSIS — E785 Hyperlipidemia, unspecified: Secondary | ICD-10-CM | POA: Diagnosis not present

## 2018-03-11 ENCOUNTER — Other Ambulatory Visit: Payer: Self-pay | Admitting: Urology

## 2018-03-11 MED ORDER — GEMCITABINE CHEMO FOR BLADDER INSTILLATION 2000 MG: 2000.0000 mg | Freq: Once | INTRAVENOUS | Status: AC

## 2018-04-05 ENCOUNTER — Other Ambulatory Visit: Payer: Self-pay

## 2018-04-05 ENCOUNTER — Encounter (HOSPITAL_BASED_OUTPATIENT_CLINIC_OR_DEPARTMENT_OTHER): Payer: Self-pay | Admitting: *Deleted

## 2018-04-05 NOTE — Progress Notes (Signed)
Spoke w/ pt daughter, Kalman Shan, via phone for pre-op interview.  Pt speak [place].  Daughter will be interpreter dos.  Daughter verbalized understanding for pt to be npo after mn.  Arrive at 0530.  Will take prilosec am dos w/ sips of water.

## 2018-04-09 NOTE — Anesthesia Preprocedure Evaluation (Addendum)
Anesthesia Evaluation  Patient identified by MRN, date of birth, ID band Patient awake    Reviewed: Allergy & Precautions, NPO status , Patient's Chart, lab work & pertinent test results  Airway Mallampati: III  TM Distance: >3 FB Neck ROM: Full    Dental no notable dental hx.    Pulmonary former smoker,    Pulmonary exam normal breath sounds clear to auscultation       Cardiovascular negative cardio ROS Normal cardiovascular exam Rhythm:Regular Rate:Normal     Neuro/Psych Anxiety negative neurological ROS     GI/Hepatic Neg liver ROS, GERD  Medicated and Controlled,  Endo/Other  negative endocrine ROS  Renal/GU negative Renal ROS     Musculoskeletal negative musculoskeletal ROS (+)   Abdominal   Peds  Hematology negative hematology ROS (+)   Anesthesia Other Findings BLADDER TUMOR  Reproductive/Obstetrics                            Anesthesia Physical Anesthesia Plan  ASA: II  Anesthesia Plan: General   Post-op Pain Management:    Induction: Intravenous  PONV Risk Score and Plan: 3 and Ondansetron, Dexamethasone, Midazolam and Treatment may vary due to age or medical condition  Airway Management Planned: LMA and Oral ETT  Additional Equipment:   Intra-op Plan:   Post-operative Plan: Extubation in OR  Informed Consent: I have reviewed the patients History and Physical, chart, labs and discussed the procedure including the risks, benefits and alternatives for the proposed anesthesia with the patient or authorized representative who has indicated his/her understanding and acceptance.   Dental advisory given  Plan Discussed with: CRNA  Anesthesia Plan Comments: (Interpretation provided by daughter)       Anesthesia Quick Evaluation

## 2018-04-10 NOTE — H&P (Signed)
HPI: Ralph Rodgers is a 72 year-old male with a history of bladder cancer who resents for transurethral resection of a recurrent tumor found on the posterior wall on the left-hand side near the dome.  His bladder cancer was superficial and limitied to the bladder lining.   He did have a TURBT. His last bladder tumor was resected 04/11/2018. He has had the following number of bladder resections: 3. He had treatment with the following intravesical agents: BCG and Gemcitabine. Patient denies Mitomycin, Interferon, Adriamycin, and Epirubicin.   He has not had blood in his urine recently. The patient denies any progressive voiding symptoms. He is not having new bone pain. He has not recently had unwanted weight loss.   His last cysto was 08/12/2017. His last radiologic test to evaluate the kidneys was 10/30/2016.   This condition would be considered of mild to moderate severity with no modifying factors or associated signs or symptoms other than as noted above.   History of bladder cancer: Bladder cancer (unknown stage) diagnosed in 2006 in [place]. Treated with resection and BCG x 6. Had been getting annual surveillance cystoscopy without evidence of recurrence until 11/17 when he was noted to have a urethral stricture that was unpassable and the procedure was aborted. This was in [place].  CT scan 7/18 - no abnormality of the upper tract, extravesical extension or adenopathy with a 1.4 cm mucosal lesion on the left wall bladder.  TURBT 01/23/17: He was found to have a urethral stricture that required balloon dilatation.  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 He also underwent dilation of his bulbar urethral stricture at the time of surgery.     ALLERGIES: No Allergies    MEDICATIONS: Prilosec  Zyrtec  10 mg capsule  Amoxicillin  Citalopram Hbr 20 mg tablet     GU PSH: Cysto Dilate Stricture (M or F) - 03/02/2018, 12/01/2017, 08/12/2017, 05/10/2017, 01/22/2017 Cystoscopy - 12/03/2016 Cystoscopy TURBT <2 cm - 01/22/2017 Cystoscopy TURBT 2-5 cm - 2006    NON-GU PSH: Appendectomy (open) - 1994 Cataract surgery, Bilateral    GU PMH: Bladder Cancer Posterior, He was found to have a small recurrence that appears very superficial on the posterior wall of the bladder on the left-hand side near the dome. He contacted his daughter and she served as an Astronomer and I informed him of the need to treat this as an outpatient with transurethral resection and placement of intravesical chemotherapy postoperatively. - 03/02/2018 History of bladder cancer (Stable), He had no evidence of recurrent transitional cell carcinoma noted of the bladder on cystoscopy today. He will return in 3 months for repeat surveillance cystoscopy. - 12/01/2017, (Stable), He had no evidence of recurrent transitional cell carcinoma of the bladder. He will continue surveillance cystoscopy., - 08/12/2017 (Stable), He had no evidence of recurrent transitional cell carcinoma of the bladder today. I am going to send urine for cytology and will tentatively plan to see him back in 3 months for repeat surveillance cystoscopy., - 05/10/2017 Bulbar urethral stricture - 12/03/2016    NON-GU PMH: Anxiety GERD    FAMILY HISTORY: 1 Daughter - Daughter 1 son - Son Death In The Family Father - Runs in Family   SOCIAL HISTORY: Marital Status: Married Preferred Language: [place]; Ethnicity: Not Hispanic Or Latino Current Smoking Status: Patient does not smoke anymore. Has  not smoked since 12/03/1980.   Tobacco Use Assessment Completed: Used Tobacco in last 30 days? Does not use smokeless tobacco. Has never drank.  Does not use drugs. Drinks 2 caffeinated drinks per day.    REVIEW OF SYSTEMS:    GU Review Male:   Patient denies frequent urination,  hard to postpone urination, burning/ pain with urination, get up at night to urinate, leakage of urine, stream starts and stops, trouble starting your stream, have to strain to urinate , erection problems, and penile pain.  Gastrointestinal (Upper):   Patient denies nausea, vomiting, and indigestion/ heartburn.  Gastrointestinal (Lower):   Patient denies diarrhea and constipation.  Constitutional:   Patient denies fever, night sweats, weight loss, and fatigue.  Skin:   Patient denies skin rash/ lesion and itching.  Eyes:   Patient denies blurred vision and double vision.  Ears/ Nose/ Throat:   Patient denies sore throat and sinus problems.  Hematologic/Lymphatic:   Patient denies swollen glands and easy bruising.  Cardiovascular:   Patient denies leg swelling and chest pains.  Respiratory:   Patient denies cough and shortness of breath.  Endocrine:   Patient denies excessive thirst.  Musculoskeletal:   Patient denies back pain and joint pain.  Neurological:   Patient denies headaches and dizziness.  Psychologic:   Patient denies depression and anxiety.   VITAL SIGNS:  Weight 180 lb / 81.65 kg  Height 69 in / 175.26 cm  BP 132/77 mmHg  Pulse 69 /min  BMI 26.6 kg/m     GU PHYSICAL EXAMINATION:    Scrotum: No lesions. No edema. No cysts. No warts.  Testes: No tenderness, no swelling, no enlargement left testes. No tenderness, no swelling, no enlargement right testes. Normal location left testes. Normal location right testes. No mass, no cyst, no varicocele, no hydrocele left testes. No mass, no cyst, no varicocele, no hydrocele right testes.  Urethral Meatus: Normal size. No lesion, no wart, no discharge, no polyp. Normal location.  Penis: Circumcised, no warts, no cracks. No dorsal Peyronie's plaques, no left corporal Peyronie's plaques, no right corporal Peyronie's plaques, no scarring, no warts. No balanitis, no meatal stenosis.   MULTI-SYSTEM PHYSICAL EXAMINATION:    Constitutional:  Well-nourished. No physical deformities. Normally developed. Good grooming.  Neck: Neck symmetrical, not swollen. Normal tracheal position.  Respiratory: No labored breathing, no use of accessory muscles. CTAB  Cardiovascular: Normal temperature, normal extremity pulses, no swelling, no varicosities. No murmurs  Lymphatic: No enlargement of neck, axillae, groin.  Skin: No paleness, no jaundice, no cyanosis. No lesion, no ulcer, no rash.  Neurologic / Psychiatric: Oriented to time, oriented to place, oriented to person. No depression, no anxiety, no agitation.  Gastrointestinal: No mass, no tenderness, no rigidity, non obese abdomen. No CVA tenderness  Eyes: Normal conjunctivae. Normal eyelids.  Ears, Nose, Mouth, and Throat: Left ear no scars, no lesions, no masses. Right ear no scars, no lesions, no masses. Nose no scars, no lesions, no masses. Normal hearing. Normal lips.  Musculoskeletal: Normal gait and station of head and neck.    PAST DATA REVIEWED:  Source Of History:  Patient  Lab Test Review:   Path Report  Records Review:   Previous Patient Records   03/10/17  PSA  Total PSA 0.84 ng/dl    PROCEDURES: None   ASSESSMENT/PLAN:  GU: Bladder Cancer Posterior - C67.4 He was found to have a small recurrence that appears very superficial on the posterior wall of the bladder on the left-hand  side near the dome. He contacted his daughter and she served as an Astronomer and I informed him of the need to treat this as an outpatient with transurethral resection and placement of intravesical chemotherapy postoperatively.   Bulbar urethral stricture - N35.011 Stable - I again found the mild bulbar stricture at the time of cystoscopy but was able to pass my scope through this area and dilated with the scope. I will dilated further at the time of surgery.

## 2018-04-11 ENCOUNTER — Encounter (HOSPITAL_BASED_OUTPATIENT_CLINIC_OR_DEPARTMENT_OTHER): Payer: Self-pay

## 2018-04-11 ENCOUNTER — Encounter (HOSPITAL_BASED_OUTPATIENT_CLINIC_OR_DEPARTMENT_OTHER)
Admission: RE | Disposition: A | Discharge status: Home / Self Care | Payer: Self-pay | Source: Home / Self Care | Attending: Urology

## 2018-04-11 ENCOUNTER — Ambulatory Visit (HOSPITAL_BASED_OUTPATIENT_CLINIC_OR_DEPARTMENT_OTHER): Payer: Medicare Other | Admitting: Anesthesiology

## 2018-04-11 ENCOUNTER — Other Ambulatory Visit: Payer: Self-pay

## 2018-04-11 ENCOUNTER — Ambulatory Visit (HOSPITAL_BASED_OUTPATIENT_CLINIC_OR_DEPARTMENT_OTHER)
Admission: RE | Admit: 2018-04-11 | Discharge: 2018-04-11 | Disposition: A | Discharge status: Home / Self Care | Payer: Medicare Other | Attending: Urology | Admitting: Urology

## 2018-04-11 DIAGNOSIS — K219 Gastro-esophageal reflux disease without esophagitis: Secondary | ICD-10-CM | POA: Insufficient documentation

## 2018-04-11 DIAGNOSIS — D494 Neoplasm of unspecified behavior of bladder: Secondary | ICD-10-CM

## 2018-04-11 DIAGNOSIS — Z87891 Personal history of nicotine dependence: Secondary | ICD-10-CM | POA: Diagnosis not present

## 2018-04-11 DIAGNOSIS — N35912 Unspecified bulbous urethral stricture, male: Secondary | ICD-10-CM | POA: Insufficient documentation

## 2018-04-11 DIAGNOSIS — C674 Malignant neoplasm of posterior wall of bladder: Secondary | ICD-10-CM | POA: Diagnosis not present

## 2018-04-11 DIAGNOSIS — C679 Malignant neoplasm of bladder, unspecified: Secondary | ICD-10-CM | POA: Diagnosis not present

## 2018-04-11 DIAGNOSIS — N35911 Unspecified urethral stricture, male, meatal: Secondary | ICD-10-CM | POA: Insufficient documentation

## 2018-04-11 HISTORY — PX: TRANSURETHRAL RESECTION OF BLADDER TUMOR: SHX2575

## 2018-04-11 HISTORY — DX: Personal history of diseases of the skin and subcutaneous tissue: Z87.2

## 2018-04-11 HISTORY — DX: Personal history of malignant neoplasm of bladder: Z85.51

## 2018-04-11 HISTORY — DX: Neoplasm of unspecified behavior of bladder: D49.4

## 2018-04-11 HISTORY — DX: Personal history of other mental and behavioral disorders: Z86.59

## 2018-04-11 SURGERY — TURBT (TRANSURETHRAL RESECTION OF BLADDER TUMOR)
Anesthesia: General | Site: Bladder

## 2018-04-11 MED ORDER — CIPROFLOXACIN IN D5W 400 MG/200ML IV SOLN
400.0000 mg | Freq: Once | INTRAVENOUS | Status: AC
  Administered 2018-04-11: 400 mg via INTRAVENOUS
  Filled 2018-04-11: qty 200

## 2018-04-11 MED ORDER — PHENAZOPYRIDINE HCL 200 MG PO TABS: 200.0000 mg | ORAL_TABLET | Freq: Three times a day (TID) | ORAL | 0 refills | Status: DC | PRN

## 2018-04-11 MED ORDER — LACTATED RINGERS IV SOLN
INTRAVENOUS | Status: DC
  Administered 2018-04-11: 1000 mL via INTRAVENOUS
  Filled 2018-04-11: qty 1000

## 2018-04-11 MED ORDER — FENTANYL CITRATE (PF) 100 MCG/2ML IJ SOLN
INTRAMUSCULAR | Status: DC | PRN
  Administered 2018-04-11: 50 ug via INTRAVENOUS

## 2018-04-11 MED ORDER — MIDAZOLAM HCL 2 MG/2ML IJ SOLN
INTRAMUSCULAR | Status: DC | PRN
  Administered 2018-04-11: 2 mg via INTRAVENOUS

## 2018-04-11 MED ORDER — SODIUM CHLORIDE 0.9 % IR SOLN
Status: DC | PRN
  Administered 2018-04-11: 6000 mL

## 2018-04-11 MED ORDER — HYDROCODONE-ACETAMINOPHEN 10-325 MG PO TABS: 1.0000 | ORAL_TABLET | ORAL | 0 refills | Status: DC | PRN

## 2018-04-11 MED ORDER — MIDAZOLAM HCL 2 MG/2ML IJ SOLN
INTRAMUSCULAR | Status: AC
  Filled 2018-04-11: qty 2

## 2018-04-11 MED ORDER — FENTANYL CITRATE (PF) 100 MCG/2ML IJ SOLN
INTRAMUSCULAR | Status: AC
  Filled 2018-04-11: qty 2

## 2018-04-11 MED ORDER — CIPROFLOXACIN IN D5W 400 MG/200ML IV SOLN
INTRAVENOUS | Status: AC
  Filled 2018-04-11: qty 200

## 2018-04-11 MED ORDER — PROPOFOL 10 MG/ML IV BOLUS
INTRAVENOUS | Status: DC | PRN
  Administered 2018-04-11: 160 mg via INTRAVENOUS

## 2018-04-11 MED ORDER — PROPOFOL 10 MG/ML IV BOLUS
INTRAVENOUS | Status: AC
  Filled 2018-04-11: qty 20

## 2018-04-11 MED ORDER — DEXAMETHASONE SODIUM PHOSPHATE 10 MG/ML IJ SOLN
INTRAMUSCULAR | Status: DC | PRN
  Administered 2018-04-11: 8 mg via INTRAVENOUS

## 2018-04-11 MED ORDER — LIDOCAINE 2% (20 MG/ML) 5 ML SYRINGE
INTRAMUSCULAR | Status: AC
  Filled 2018-04-11: qty 5

## 2018-04-11 MED ORDER — GEMCITABINE CHEMO FOR BLADDER INSTILLATION 2000 MG
2000.0000 mg | Freq: Once | INTRAVENOUS | Status: DC
  Filled 2018-04-11: qty 52.6

## 2018-04-11 MED ORDER — LIDOCAINE HCL (CARDIAC) PF 100 MG/5ML IV SOSY
PREFILLED_SYRINGE | INTRAVENOUS | Status: DC | PRN
  Administered 2018-04-11: 100 mg via INTRAVENOUS

## 2018-04-11 MED ORDER — ONDANSETRON HCL 4 MG/2ML IJ SOLN
INTRAMUSCULAR | Status: DC | PRN
  Administered 2018-04-11: 4 mg via INTRAVENOUS

## 2018-04-11 MED ORDER — DEXAMETHASONE SODIUM PHOSPHATE 10 MG/ML IJ SOLN
INTRAMUSCULAR | Status: AC
  Filled 2018-04-11: qty 1

## 2018-04-11 MED ORDER — ONDANSETRON HCL 4 MG/2ML IJ SOLN
INTRAMUSCULAR | Status: AC
  Filled 2018-04-11: qty 2

## 2018-04-11 SURGICAL SUPPLY — 34 items
BAG DRAIN URO-CYSTO SKYTR STRL (DRAIN) ×2 IMPLANT
BAG URINE DRAINAGE (UROLOGICAL SUPPLIES) ×2 IMPLANT
BAG URINE LEG 19OZ MD ST LTX (BAG) ×2 IMPLANT
CATH FOLEY 2WAY SLVR  5CC 18FR (CATHETERS) ×1
CATH FOLEY 2WAY SLVR  5CC 20FR (CATHETERS)
CATH FOLEY 2WAY SLVR  5CC 22FR (CATHETERS)
CATH FOLEY 2WAY SLVR  5CC 24FR (CATHETERS) ×1
CATH FOLEY 2WAY SLVR 5CC 18FR (CATHETERS) ×1 IMPLANT
CATH FOLEY 2WAY SLVR 5CC 20FR (CATHETERS) IMPLANT
CATH FOLEY 2WAY SLVR 5CC 22FR (CATHETERS) IMPLANT
CATH FOLEY 2WAY SLVR 5CC 24FR (CATHETERS) ×1 IMPLANT
CATH FOLEY 3WAY 20FR (CATHETERS) IMPLANT
CLOTH BEACON ORANGE TIMEOUT ST (SAFETY) ×2 IMPLANT
ELECT REM PT RETURN 9FT ADLT (ELECTROSURGICAL)
ELECTRODE REM PT RTRN 9FT ADLT (ELECTROSURGICAL) IMPLANT
EVACUATOR MICROVAS BLADDER (UROLOGICAL SUPPLIES) IMPLANT
GLOVE BIO SURGEON STRL SZ 6.5 (GLOVE) ×2 IMPLANT
GLOVE BIO SURGEON STRL SZ8 (GLOVE) ×2 IMPLANT
GLOVE BIOGEL PI IND STRL 6.5 (GLOVE) ×1 IMPLANT
GLOVE BIOGEL PI INDICATOR 6.5 (GLOVE) ×1
GOWN STRL REUS W/ TWL LRG LVL3 (GOWN DISPOSABLE) ×1 IMPLANT
GOWN STRL REUS W/ TWL XL LVL3 (GOWN DISPOSABLE) ×1 IMPLANT
GOWN STRL REUS W/TWL LRG LVL3 (GOWN DISPOSABLE) ×1
GOWN STRL REUS W/TWL XL LVL3 (GOWN DISPOSABLE) ×1
HOLDER FOLEY CATH W/STRAP (MISCELLANEOUS) IMPLANT
IV NS IRRIG 3000ML ARTHROMATIC (IV SOLUTION) ×4 IMPLANT
KIT TURNOVER CYSTO (KITS) ×2 IMPLANT
LOOP CUT BIPOLAR 24F LRG (ELECTROSURGICAL) ×2 IMPLANT
MANIFOLD NEPTUNE II (INSTRUMENTS) ×2 IMPLANT
PACK CYSTO (CUSTOM PROCEDURE TRAY) ×2 IMPLANT
PLUG CATH AND CAP STER (CATHETERS) IMPLANT
TUBE CONNECTING 12X1/4 (SUCTIONS) ×2 IMPLANT
TUBING UROLOGY SET (TUBING) ×2 IMPLANT
WATER STERILE IRR 3000ML UROMA (IV SOLUTION) IMPLANT

## 2018-04-11 NOTE — Op Note (Signed)
PATIENT:  Ralph Rodgers  PRE-OPERATIVE DIAGNOSIS: 1.  Bladder tumor 2.  Bulbar urethral stricture  POST-OPERATIVE DIAGNOSIS: 1.  Bladder tumor 2.  Mild meatal stenosis. 3.  Bulbar urethral stricture  PROCEDURE:  Procedure(s): 1. TRANSURETHRAL RESECTION OF BLADDER TUMOR (TURBT) (1.0 cm.) 2.  Dilation of bulbar urethral stricture and mild meatal stenosis 3.  Instillation of intravesical chemotherapy (Gemcitabine)  SURGEON:  Surgeon(s): Claybon Jabs  ANESTHESIA:   General  EBL:  Minimal  DRAINS: Urethral catheter (18 Fr. Foley)   SPECIMEN:  Bladder tumor  DISPOSITION OF SPECIMEN:  PATHOLOGY  Indication: Ralph Rodgers is a 72 year old male with a history of bladder cancer initially having been diagnosed in 2006.  He recently was found to have a papillary recurrence as well as a bulbar urethral stricture at the time of surveillance cystoscopy.  He is brought to the operating room for management of his stricture and resection of his bladder tumor with postoperative intravesical gemcitabine.  Description of operation: The patient was taken to the operating room and administered general anesthesia. They were then placed on the table and moved to the dorsal lithotomy position after which the genitalia was sterilely prepped and draped. An official timeout was then performed.  I initially attempted to place the 26 French resectoscope but the meatus was not accommodating and therefore was dilated with the [employer] sounds gently up to 30 [place].  The 58 French resectoscope with the 30 lens and visual obturator were then passed down the urethra under direct vision where a mild bulbar urethral stricture was identified and photographed.  I then removed the resectoscope and dilated this with the [employer] sounds starting at 41 French and progressing up to 47 [place].  There was very little resistance indicating a very mild stricture.   I then reinserted the 26 French resectoscope and passed this  under direct vision and noted the bulbar urethral stricture was widely patent.  This was photographed.  I then passed the scope on into the bladder into the bladder under direct visualization.  The prostatic urethra revealed mild bilobar hypertrophy with no significant obstruction.  The visual obturator was then removed and the Gyrus resectoscope element with 30  lens was then inserted and the bladder was fully and systematically inspected. Ureteral orifices were noted to be in the normal anatomic positions.  There was 1-2+ trabeculation noted.  The bladder tumor was identified on the left posterior wall.  Full and systematic inspection of the bladder revealed no other tumors or worrisome lesions.  I first began by resecting the bladder tumor completely.  I then fulgurated the base and surrounding mucosa at the location of the resection.  Reinspection of the bladder revealed all obvious tumor had been fully resected and there was no evidence of perforation. The Microvasive evacuator was then used to irrigate the bladder and remove all of the portions of bladder tumor which were sent to pathology. I then removed the resectoscope.  A 18 French Foley catheter was then inserted in the bladder and irrigated. The irrigant returned completely clear with no clots. The patient was awakened and taken to the recovery room.  While in the recovery room 2 g of gemcitabine in 52.6 cc of sterile water was instilled in the bladder through the catheter and the catheter was plugged. This will remain indwelling for approximately one hour. It will then be drained from the bladder and the catheter will be removed and the patient discharged home.  PLAN OF CARE: Discharge  to home after PACU  PATIENT DISPOSITION:  PACU - hemodynamically stable.

## 2018-04-11 NOTE — Transfer of Care (Signed)
Immediate Anesthesia Transfer of Care Note  Patient: Ralph Rodgers  Procedure(s) Performed: TRANSURETHRAL RESECTION OF BLADDER TUMOR (TURBT) WITH CYSTOSCOPY, INTRAVESICAL CHEMOTHERAPY INSTILLATION (N/A Bladder)  Patient Location: PACU  Anesthesia Type:General  Level of Consciousness: awake, alert , oriented, drowsy and patient cooperative  Airway & Oxygen Therapy: Patient Spontanous Breathing  Post-op Assessment: Report given to RN and Post -op Vital signs reviewed and stable  Post vital signs: Reviewed and stable  Last Vitals:  Vitals Value Taken Time  BP 137/73 04/11/2018  8:04 AM  Temp    Pulse 73 04/11/2018  8:05 AM  Resp 14 04/11/2018  8:05 AM  SpO2 93 % 04/11/2018  8:05 AM  Vitals shown include unvalidated device data.  Last Pain:  Vitals:   04/11/18 0549  TempSrc:   PainSc: 0-No pain      Patients Stated Pain Goal: 3 (89/16/94 5038)  Complications: No apparent anesthesia complications

## 2018-04-11 NOTE — Discharge Instructions (Signed)
Transurethral Resection of Bladder Tumor (TURBT)   Definition:  Transurethral Resection of the Bladder Tumor is a surgical procedure used to diagnose and remove tumors within the bladder. TURBT is the most common treatment for early stage bladder cancer.  General instructions:     Your recent bladder surgery requires very little post hospital care but some definite precautions.  Despite the fact that no skin incisions were used, the area around the bladder incisions are raw and covered with scabs to promote healing and prevent bleeding. Certain precautions are needed to insure that the scabs are not disturbed over the next 2-4 weeks while the healing proceeds.  Because the raw surface inside your bladder and the irritating effects of urine you may expect frequency of urination and/or urgency (a stronger desire to urinate) and perhaps even getting up at night more often. This will usually resolve or improve slowly over the healing period. You may see some blood in your urine over the first 6 weeks. Do not be alarmed, even if the urine was clear for a while. Get off your feet and drink lots of fluids until clearing occurs. If you start to pass clots or don't improve call us.  Catheter: (If you are discharged with a catheter.)  1. Keep your catheter secured to your leg at all times with tape or the supplied strap. 2. You may experience leakage of urine around your catheter- as long as the  catheter continues to drain, this is normal.  If your catheter stops draining  go to the ER. 3. You may also have blood in your urine, even after it has been clear for  several days; you may even pass some small blood clots or other material.  This  is normal as well.  If this happens, sit down and drink plenty of water to help  make urine to flush out your bladder.  If the blood in your urine becomes worse  after doing this, contact our office or return to the ER. 4. You may use the leg bag (small bag)  during the day, but use the large bag at  night.  Diet:  You may return to your normal diet immediately. Because of the raw surface of your bladder, alcohol, spicy foods, foods high in acid and drinks with caffeine may cause irritation or frequency and should be used in moderation. To keep your urine flowing freely and avoid constipation, drink plenty of fluids during the day (8-10 glasses). Tip: Avoid cranberry juice because it is very acidic.  Activity:  Your physical activity doesn't need to be restricted. However, if you are very active, you may see some blood in the urine. We suggest that you reduce your activity under the circumstances until the bleeding has stopped.  Bowels:  It is important to keep your bowels regular during the postoperative period. Straining with bowel movements can cause bleeding. A bowel movement every other day is reasonable. Use a mild laxative if needed, such as milk of magnesia 2-3 tablespoons, or 2 Dulcolax tablets. Call if you continue to have problems. If you had been taking narcotics for pain, before, during or after your surgery, you may be constipated. Take a laxative if necessary.    Medication:  You should resume your pre-surgery medications unless told not to. In addition you may be given an antibiotic to prevent or treat infection. Antibiotics are not always necessary. All medication should be taken as prescribed until the bottles are finished unless you are having  an unusual reaction to one of the drugs.   Post Anesthesia Home Care Instructions  Activity: Get plenty of rest for the remainder of the day. A responsible individual must stay with you for 24 hours following the procedure.  For the next 24 hours, DO NOT: -Drive a car -Paediatric nurse -Drink alcoholic beverages -Take any medication unless instructed by your physician -Make any legal decisions or sign important papers.  Meals: Start with liquid foods such as gelatin or soup.  Progress to regular foods as tolerated. Avoid greasy, spicy, heavy foods. If nausea and/or vomiting occur, drink only clear liquids until the nausea and/or vomiting subsides. Call your physician if vomiting continues.  Special Instructions/Symptoms: Your throat may feel dry or sore from the anesthesia or the breathing tube placed in your throat during surgery. If this causes discomfort, gargle with warm salt water. The discomfort should disappear within 24 hours.  If you had a scopolamine patch placed behind your ear for the management of post- operative nausea and/or vomiting:  1. The medication in the patch is effective for 72 hours, after which it should be removed.  Wrap patch in a tissue and discard in the trash. Wash hands thoroughly with soap and water. 2. You may remove the patch earlier than 72 hours if you experience unpleasant side effects which may include dry mouth, dizziness or visual disturbances. 3. Avoid touching the patch. Wash your hands with soap and water after contact with the patch.    Post Anesthesia Home Care Instructions  Activity: Get plenty of rest for the remainder of the day. A responsible individual must stay with you for 24 hours following the procedure.  For the next 24 hours, DO NOT: -Drive a car -Paediatric nurse -Drink alcoholic beverages -Take any medication unless instructed by your physician -Make any legal decisions or sign important papers.  Meals: Start with liquid foods such as gelatin or soup. Progress to regular foods as tolerated. Avoid greasy, spicy, heavy foods. If nausea and/or vomiting occur, drink only clear liquids until the nausea and/or vomiting subsides. Call your physician if vomiting continues.  Special Instructions/Symptoms: Your throat may feel dry or sore from the anesthesia or the breathing tube placed in your throat during surgery. If this causes discomfort, gargle with warm salt water. The discomfort should disappear within 24  hours.  If you had a scopolamine patch placed behind your ear for the management of post- operative nausea and/or vomiting:  1. The medication in the patch is effective for 72 hours, after which it should be removed.  Wrap patch in a tissue and discard in the trash. Wash hands thoroughly with soap and water. 2. You may remove the patch earlier than 72 hours if you experience unpleasant side effects which may include dry mouth, dizziness or visual disturbances. 3. Avoid touching the patch. Wash your hands with soap and water after contact with the patch.

## 2018-04-11 NOTE — Anesthesia Postprocedure Evaluation (Signed)
Anesthesia Post Note  Patient: [name]  Procedure(s) Performed: TRANSURETHRAL RESECTION OF BLADDER TUMOR (TURBT) WITH CYSTOSCOPY, INTRAVESICAL CHEMOTHERAPY INSTILLATION (N/A Bladder)     Patient location during evaluation: PACU Anesthesia Type: General Level of consciousness: awake and alert Pain management: pain level controlled Vital Signs Assessment: post-procedure vital signs reviewed and stable Respiratory status: spontaneous breathing, nonlabored ventilation, respiratory function stable and patient connected to nasal cannula oxygen Cardiovascular status: blood pressure returned to baseline and stable Postop Assessment: no apparent nausea or vomiting Anesthetic complications: no    Last Vitals:  Vitals:   04/11/18 0945 04/11/18 1029  BP: 132/60 139/73  Pulse: 66 64  Resp: 16 16  Temp:  36.7 C  SpO2: 94% 97%    Last Pain:  Vitals:   04/11/18 1029  TempSrc: Oral  PainSc: 0-No pain                 Ryan P Ellender

## 2018-04-11 NOTE — Anesthesia Procedure Notes (Signed)
Procedure Name: LMA Insertion Date/Time: 04/11/2018 7:40 AM Performed by: Raenette Rover, CRNA Pre-anesthesia Checklist: Patient identified, Emergency Drugs available, Suction available and Patient being monitored Patient Re-evaluated:Patient Re-evaluated prior to induction Oxygen Delivery Method: Circle system utilized Preoxygenation: Pre-oxygenation with 100% oxygen Induction Type: IV induction LMA: LMA inserted LMA Size: 4.0 Number of attempts: 1 Placement Confirmation: positive ETCO2,  CO2 detector and breath sounds checked- equal and bilateral Tube secured with: Tape Dental Injury: Teeth and Oropharynx as per pre-operative assessment

## 2018-04-12 ENCOUNTER — Encounter (HOSPITAL_BASED_OUTPATIENT_CLINIC_OR_DEPARTMENT_OTHER): Payer: Self-pay | Admitting: Urology

## 2018-04-25 DIAGNOSIS — N35011 Post-traumatic bulbous urethral stricture: Secondary | ICD-10-CM | POA: Diagnosis not present

## 2018-04-25 DIAGNOSIS — Z8551 Personal history of malignant neoplasm of bladder: Secondary | ICD-10-CM | POA: Diagnosis not present

## 2018-05-09 DIAGNOSIS — L739 Follicular disorder, unspecified: Secondary | ICD-10-CM | POA: Diagnosis not present

## 2018-05-09 DIAGNOSIS — Z8551 Personal history of malignant neoplasm of bladder: Secondary | ICD-10-CM | POA: Diagnosis not present

## 2018-05-09 DIAGNOSIS — F411 Generalized anxiety disorder: Secondary | ICD-10-CM | POA: Diagnosis not present

## 2018-05-09 DIAGNOSIS — L309 Dermatitis, unspecified: Secondary | ICD-10-CM | POA: Diagnosis not present

## 2018-05-09 DIAGNOSIS — L719 Rosacea, unspecified: Secondary | ICD-10-CM | POA: Diagnosis not present

## 2018-05-09 DIAGNOSIS — E785 Hyperlipidemia, unspecified: Secondary | ICD-10-CM | POA: Diagnosis not present

## 2018-06-28 DIAGNOSIS — J029 Acute pharyngitis, unspecified: Secondary | ICD-10-CM | POA: Diagnosis not present

## 2018-09-27 DIAGNOSIS — L739 Follicular disorder, unspecified: Secondary | ICD-10-CM | POA: Diagnosis not present

## 2018-10-11 DIAGNOSIS — N35011 Post-traumatic bulbous urethral stricture: Secondary | ICD-10-CM | POA: Diagnosis not present

## 2018-10-11 DIAGNOSIS — Z8551 Personal history of malignant neoplasm of bladder: Secondary | ICD-10-CM | POA: Diagnosis not present

## 2018-11-17 DIAGNOSIS — L700 Acne vulgaris: Secondary | ICD-10-CM | POA: Diagnosis not present

## 2018-11-17 DIAGNOSIS — Z79899 Other long term (current) drug therapy: Secondary | ICD-10-CM | POA: Diagnosis not present

## 2018-11-17 DIAGNOSIS — L718 Other rosacea: Secondary | ICD-10-CM | POA: Diagnosis not present

## 2019-01-10 DIAGNOSIS — Z8551 Personal history of malignant neoplasm of bladder: Secondary | ICD-10-CM | POA: Diagnosis not present

## 2019-01-10 DIAGNOSIS — N35011 Post-traumatic bulbous urethral stricture: Secondary | ICD-10-CM | POA: Diagnosis not present

## 2019-02-06 DIAGNOSIS — M130 Polyarthritis, unspecified: Secondary | ICD-10-CM | POA: Diagnosis not present

## 2019-02-20 DIAGNOSIS — L739 Follicular disorder, unspecified: Secondary | ICD-10-CM | POA: Diagnosis not present

## 2019-02-20 DIAGNOSIS — Z79899 Other long term (current) drug therapy: Secondary | ICD-10-CM | POA: Diagnosis not present

## 2019-02-20 DIAGNOSIS — L738 Other specified follicular disorders: Secondary | ICD-10-CM | POA: Diagnosis not present

## 2019-02-24 DIAGNOSIS — Z23 Encounter for immunization: Secondary | ICD-10-CM | POA: Diagnosis not present

## 2019-06-03 ENCOUNTER — Ambulatory Visit: Payer: Medicare Other | Attending: Internal Medicine

## 2019-06-03 DIAGNOSIS — Z23 Encounter for immunization: Secondary | ICD-10-CM | POA: Insufficient documentation

## 2019-06-03 NOTE — Progress Notes (Signed)
   Covid-19 Vaccination Clinic  Name:  Ralph Rodgers    MRN: [id number] DOB: 01/17/1946  06/03/2019  Mr. Ralph Rodgers was observed post Covid-19 immunization for 15 minutes without incidence. He was provided with Vaccine Information Sheet and instruction to access the V-Safe system.   Mr. Ralph Rodgers was instructed to call 911 with any severe reactions post vaccine: Marland Kitchen Difficulty breathing  . Swelling of your face and throat  . A fast heartbeat  . A bad rash all over your body  . Dizziness and weakness    Immunizations Administered    Name Date Dose VIS Date Route   Pfizer COVID-19 Vaccine 06/03/2019  9:20 AM 0.3 mL 04/07/2019 Intramuscular   Manufacturer: [place]   Lot: [id number]   [place]: [id number]

## 2019-06-20 ENCOUNTER — Ambulatory Visit: Payer: Medicare Other

## 2019-06-28 ENCOUNTER — Ambulatory Visit: Payer: Medicare Other | Attending: Internal Medicine

## 2019-06-28 DIAGNOSIS — Z23 Encounter for immunization: Secondary | ICD-10-CM

## 2019-06-28 NOTE — Progress Notes (Signed)
   Covid-19 Vaccination Clinic  Name:  Dywan Burki    MRN: [id number] DOB: 11-23-1945  06/28/2019  Mr. Buckwalter was observed post Covid-19 immunization for 15 minutes without incident. He was provided with Vaccine Information Sheet and instruction to access the V-Safe system.   Mr. Rugar was instructed to call 911 with any severe reactions post vaccine: Marland Kitchen Difficulty breathing  . Swelling of face and throat  . A fast heartbeat  . A bad rash all over body  . Dizziness and weakness   Immunizations Administered    Name Date Dose VIS Date Route   Pfizer COVID-19 Vaccine 06/28/2019  9:04 AM 0.3 mL 04/07/2019 Intramuscular   Manufacturer: [place]   Lot: [id number]   [place]: [id number]

## 2019-07-19 DIAGNOSIS — N35011 Post-traumatic bulbous urethral stricture: Secondary | ICD-10-CM | POA: Diagnosis not present

## 2019-07-19 DIAGNOSIS — C672 Malignant neoplasm of lateral wall of bladder: Secondary | ICD-10-CM | POA: Diagnosis not present

## 2019-10-10 DIAGNOSIS — Z8551 Personal history of malignant neoplasm of bladder: Secondary | ICD-10-CM | POA: Diagnosis not present

## 2020-02-06 DIAGNOSIS — Z23 Encounter for immunization: Secondary | ICD-10-CM | POA: Diagnosis not present

## 2020-04-05 DIAGNOSIS — L738 Other specified follicular disorders: Secondary | ICD-10-CM | POA: Diagnosis not present

## 2020-04-09 DIAGNOSIS — Z23 Encounter for immunization: Secondary | ICD-10-CM | POA: Diagnosis not present

## 2020-07-11 DIAGNOSIS — M542 Cervicalgia: Secondary | ICD-10-CM | POA: Diagnosis not present

## 2020-07-11 DIAGNOSIS — F411 Generalized anxiety disorder: Secondary | ICD-10-CM | POA: Diagnosis not present

## 2020-07-11 DIAGNOSIS — R040 Epistaxis: Secondary | ICD-10-CM | POA: Diagnosis not present

## 2020-07-11 DIAGNOSIS — M545 Low back pain, unspecified: Secondary | ICD-10-CM | POA: Diagnosis not present

## 2020-07-11 DIAGNOSIS — Z125 Encounter for screening for malignant neoplasm of prostate: Secondary | ICD-10-CM | POA: Diagnosis not present

## 2020-07-11 DIAGNOSIS — L309 Dermatitis, unspecified: Secondary | ICD-10-CM | POA: Diagnosis not present

## 2020-07-11 DIAGNOSIS — E785 Hyperlipidemia, unspecified: Secondary | ICD-10-CM | POA: Diagnosis not present

## 2020-07-12 DIAGNOSIS — M542 Cervicalgia: Secondary | ICD-10-CM | POA: Diagnosis not present

## 2020-07-12 DIAGNOSIS — M545 Low back pain, unspecified: Secondary | ICD-10-CM | POA: Diagnosis not present

## 2020-07-12 DIAGNOSIS — F411 Generalized anxiety disorder: Secondary | ICD-10-CM | POA: Diagnosis not present

## 2020-07-12 DIAGNOSIS — E785 Hyperlipidemia, unspecified: Secondary | ICD-10-CM | POA: Diagnosis not present

## 2020-07-12 DIAGNOSIS — R040 Epistaxis: Secondary | ICD-10-CM | POA: Diagnosis not present

## 2020-07-12 DIAGNOSIS — L309 Dermatitis, unspecified: Secondary | ICD-10-CM | POA: Diagnosis not present

## 2020-07-12 DIAGNOSIS — Z125 Encounter for screening for malignant neoplasm of prostate: Secondary | ICD-10-CM | POA: Diagnosis not present

## 2020-09-02 DIAGNOSIS — N35011 Post-traumatic bulbous urethral stricture: Secondary | ICD-10-CM | POA: Diagnosis not present

## 2020-09-02 DIAGNOSIS — Z8551 Personal history of malignant neoplasm of bladder: Secondary | ICD-10-CM | POA: Diagnosis not present

## 2020-10-30 DIAGNOSIS — H6091 Unspecified otitis externa, right ear: Secondary | ICD-10-CM | POA: Diagnosis not present

## 2020-10-30 DIAGNOSIS — Z87891 Personal history of nicotine dependence: Secondary | ICD-10-CM | POA: Diagnosis not present

## 2020-11-14 DIAGNOSIS — K2900 Acute gastritis without bleeding: Secondary | ICD-10-CM | POA: Diagnosis not present

## 2020-11-15 ENCOUNTER — Emergency Department (HOSPITAL_BASED_OUTPATIENT_CLINIC_OR_DEPARTMENT_OTHER)
Admission: EM | Admit: 2020-11-15 | Discharge: 2020-11-15 | Disposition: A | Discharge status: Home / Self Care | Payer: Medicare Other | Attending: Emergency Medicine | Admitting: Emergency Medicine

## 2020-11-15 ENCOUNTER — Emergency Department (HOSPITAL_BASED_OUTPATIENT_CLINIC_OR_DEPARTMENT_OTHER): Payer: Medicare Other

## 2020-11-15 ENCOUNTER — Other Ambulatory Visit: Payer: Self-pay

## 2020-11-15 ENCOUNTER — Encounter (HOSPITAL_BASED_OUTPATIENT_CLINIC_OR_DEPARTMENT_OTHER): Payer: Self-pay

## 2020-11-15 DIAGNOSIS — R748 Abnormal levels of other serum enzymes: Secondary | ICD-10-CM | POA: Diagnosis not present

## 2020-11-15 DIAGNOSIS — J189 Pneumonia, unspecified organism: Secondary | ICD-10-CM

## 2020-11-15 DIAGNOSIS — Z20822 Contact with and (suspected) exposure to covid-19: Secondary | ICD-10-CM | POA: Insufficient documentation

## 2020-11-15 DIAGNOSIS — R059 Cough, unspecified: Secondary | ICD-10-CM | POA: Diagnosis not present

## 2020-11-15 DIAGNOSIS — Z87891 Personal history of nicotine dependence: Secondary | ICD-10-CM | POA: Insufficient documentation

## 2020-11-15 DIAGNOSIS — I517 Cardiomegaly: Secondary | ICD-10-CM | POA: Diagnosis not present

## 2020-11-15 DIAGNOSIS — J181 Lobar pneumonia, unspecified organism: Secondary | ICD-10-CM | POA: Insufficient documentation

## 2020-11-15 DIAGNOSIS — R509 Fever, unspecified: Secondary | ICD-10-CM | POA: Diagnosis not present

## 2020-11-15 DIAGNOSIS — R945 Abnormal results of liver function studies: Secondary | ICD-10-CM | POA: Diagnosis not present

## 2020-11-15 LAB — URINALYSIS, DIPSTICK ONLY
Bilirubin Urine: NEGATIVE
Glucose, UA: NEGATIVE mg/dL
Ketones, ur: NEGATIVE mg/dL
Leukocytes,Ua: NEGATIVE
Nitrite: NEGATIVE
Protein, ur: 30 mg/dL — AB
Specific Gravity, Urine: 1.028 (ref 1.005–1.030)
pH: 6 (ref 5.0–8.0)

## 2020-11-15 LAB — COMPREHENSIVE METABOLIC PANEL
ALT: 69 U/L — ABNORMAL HIGH (ref 0–44)
AST: 69 U/L — ABNORMAL HIGH (ref 15–41)
Albumin: 3.4 g/dL — ABNORMAL LOW (ref 3.5–5.0)
Alkaline Phosphatase: 60 U/L (ref 38–126)
Anion gap: 10 (ref 5–15)
BUN: 9 mg/dL (ref 8–23)
CO2: 23 mmol/L (ref 22–32)
Calcium: 8.1 mg/dL — ABNORMAL LOW (ref 8.9–10.3)
Chloride: 98 mmol/L (ref 98–111)
Creatinine, Ser: 0.79 mg/dL (ref 0.61–1.24)
GFR, Estimated: >60 mL/min (ref >60)
Glucose, Bld: 133 mg/dL — ABNORMAL HIGH (ref 70–99)
Potassium: 3.5 mmol/L (ref 3.5–5.1)
Sodium: 131 mmol/L — ABNORMAL LOW (ref 135–145)
Total Bilirubin: 0.5 mg/dL (ref 0.3–1.2)
Total Protein: 6.2 g/dL — ABNORMAL LOW (ref 6.5–8.1)

## 2020-11-15 LAB — CBC
HCT: 35.6 % — ABNORMAL LOW (ref 39.0–52.0)
Hemoglobin: 12.1 g/dL — ABNORMAL LOW (ref 13.0–17.0)
MCH: 29.8 pg (ref 26.0–34.0)
MCHC: 34 g/dL (ref 30.0–36.0)
MCV: 87.7 fL (ref 80.0–100.0)
Platelets: 218 10*3/uL (ref 150–400)
RBC: 4.06 MIL/uL — ABNORMAL LOW (ref 4.22–5.81)
RDW: 12.1 % (ref 11.5–15.5)
WBC: 8 10*3/uL (ref 4.0–10.5)
nRBC: 0 % (ref 0.0–0.2)

## 2020-11-15 LAB — RESP PANEL BY RT-PCR (FLU A&B, COVID) ARPGX2
Influenza A by PCR: NEGATIVE
Influenza B by PCR: NEGATIVE
SARS Coronavirus 2 by RT PCR: NEGATIVE

## 2020-11-15 LAB — LIPASE, BLOOD: Lipase: 18 U/L (ref 11–51)

## 2020-11-15 MED ORDER — DOXYCYCLINE HYCLATE 100 MG PO TABS
100.0000 mg | ORAL_TABLET | Freq: Once | ORAL | Status: AC
  Administered 2020-11-15: 100 mg via ORAL
  Filled 2020-11-15: qty 1

## 2020-11-15 MED ORDER — SODIUM CHLORIDE 0.9 % IV BOLUS (SEPSIS): 500.0000 mL | Freq: Once | INTRAVENOUS | Status: DC

## 2020-11-15 MED ORDER — ONDANSETRON HCL 4 MG/2ML IJ SOLN
4.0000 mg | Freq: Once | INTRAMUSCULAR | Status: DC
  Filled 2020-11-15: qty 2

## 2020-11-15 MED ORDER — ONDANSETRON 8 MG PO TBDP: 8.0000 mg | ORAL_TABLET | Freq: Three times a day (TID) | ORAL | 0 refills | Status: DC | PRN

## 2020-11-15 MED ORDER — SODIUM CHLORIDE 0.9 % IV SOLN: 1000.0000 mL | INTRAVENOUS | Status: DC

## 2020-11-15 NOTE — Discharge Instructions (Addendum)
Take the antibiotics as prescribed.  Follow-up with your doctor to recheck your blood tests and make sure the infection clears.

## 2020-11-15 NOTE — ED Triage Notes (Signed)
"  Had covid back at the end of June, seemed better, but then in July 5th we went to [facility] and he spiked a fever and had right ear pain and was seen in ED and put on antibiotics for ear infection. He has taken a 10 day supply of Amoxicillin and his ear was better, but now since Monday fever is back, comes down with tylenol and then comes back up" per daughter  Patient states he just does not feel well.

## 2020-11-15 NOTE — ED Provider Notes (Signed)
[facility] EMERGENCY DEPT Provider Note   CSN: [id number] Arrival date & time: 11/15/20  1635     History Chief Complaint  Patient presents with   Fever   Cough    Ralph Rodgers is a 75 y.o. male.   Fever Associated symptoms: cough   Cough Associated symptoms: fever    Patient presents to the ED for evaluation of persistent fevers over this past week.  Patient initially was diagnosed with COVID in June.  The symptoms seem to resolve after several weeks.  Patient went to [facility] and the beginning of this month.  While there he developed a fever and had an earache.  He was diagnosed with an ear infection was given antibiotics for 10 days.  Patient has completed that treatment and his ear is feeling better but over the past week he has had intermittent fevers up to 102.  He has had a couple episodes of nausea vomiting but is not having any abdominal pain.  He has had an occasional cough still.  No sore throat.  No earache.  No abdominal pain.  No chest pain.  No rashes.  No tick bites.  Patient saw the primary care doctor yesterday and the blood tests were normal.  Because of his persistent symptoms today they came to the ED  Past Medical History:  Diagnosis Date   Anxiety    Bladder tumor    GERD (gastroesophageal reflux disease)    History of bladder cancer    2006---  in [place]  had resection bladder tumor and BCG treatment   History of folliculitis    facial   History of panic attacks     There are no problems to display for this patient.   Past Surgical History:  Procedure Laterality Date   APPENDECTOMY  1993   CATARACT EXTRACTION W/ INTRAOCULAR LENS  IMPLANT, BILATERAL  2015;  2018   CYSTOSCOPY W/ RETROGRADES Bilateral 01/22/2017   Procedure: CYSTOSCOPY WITH RETROGRADE PYELOGRAM;  Surgeon: Kathie Rhodes, MD;  Location: [facility];  Service: Urology;  Laterality: Bilateral;   CYSTOSCOPY WITH URETHRAL DILATATION N/A 01/22/2017   Procedure: CYSTOSCOPY  WITH URETHRAL DILATATION/ INSTILLATION OF MITOMYCIN C;  Surgeon: Kathie Rhodes, MD;  Location: [facility];  Service: Urology;  Laterality: N/A;   TRANSURETHRAL RESECTION OF BLADDER TUMOR N/A 01/22/2017   Procedure: TRANSURETHRAL RESECTION OF BLADDER TUMOR (TURBT);  Surgeon: Kathie Rhodes, MD;  Location: [facility];  Service: Urology;  Laterality: N/A;   TRANSURETHRAL RESECTION OF BLADDER TUMOR  2006   in [place]   TRANSURETHRAL RESECTION OF BLADDER TUMOR N/A 04/11/2018   Procedure: TRANSURETHRAL RESECTION OF BLADDER TUMOR (TURBT) WITH CYSTOSCOPY, INTRAVESICAL CHEMOTHERAPY INSTILLATION;  Surgeon: Kathie Rhodes, MD;  Location: [facility];  Service: Urology;  Laterality: N/A;       No family history on file.  Social History   Tobacco Use   Smoking status: Former    Years: 10.00    Types: Cigarettes    Quit date: 04/05/1973    Years since quitting: 47.6   Smokeless tobacco: Never  Vaping Use   Vaping Use: Never used  Substance Use Topics   Alcohol use: No   Drug use: No    Home Medications Prior to Admission medications   Medication Sig Start Date End Date Taking? Authorizing Provider  ondansetron (ZOFRAN ODT) 8 MG disintegrating tablet Take 1 tablet (8 mg total) by mouth every 8 (eight) hours as needed for nausea or vomiting. 11/15/20  Yes Tomi Bamberger,  Wille Glaser, MD  cetirizine (ZYRTEC) 10 MG tablet Take 10 mg by mouth daily.    [provider]  citalopram (CELEXA) 40 MG tablet Take 40 mg by mouth daily.     [provider]  HYDROcodone-acetaminophen (NORCO) 10-325 MG tablet Take 1-2 tablets by mouth every 4 (four) hours as needed for moderate pain. Maximum dose per 24 hours - 8 pills 04/11/18   Kathie Rhodes, MD  omeprazole (PRILOSEC) 20 MG capsule Take 20 mg by mouth daily as needed.    [provider]  phenazopyridine (PYRIDIUM) 200 MG tablet Take 1 tablet (200 mg total) by mouth 3 (three) times daily as needed for pain. 04/11/18   Kathie Rhodes, MD     Allergies    Other  Review of Systems   Review of Systems  Constitutional:  Positive for fever.  Respiratory:  Positive for cough.   All other systems reviewed and are negative.  Physical Exam Updated Vital Signs BP 124/66   Pulse 82   Temp 98.4 F (36.9 C)   Resp 18   Ht 1.753 m ('5\' 9"'$ )   Wt 81.6 kg   SpO2 96%   BMI 26.58 kg/m   Physical Exam Vitals and nursing note reviewed.  Constitutional:      General: He is not in acute distress.    Appearance: He is well-developed.  HENT:     Head: Normocephalic and atraumatic.     Right Ear: Tympanic membrane and external ear normal.     Left Ear: Tympanic membrane and external ear normal.     Mouth/Throat:     Pharynx: No oropharyngeal exudate or posterior oropharyngeal erythema.  Eyes:     General: No scleral icterus.       Right eye: No discharge.        Left eye: No discharge.     Conjunctiva/sclera: Conjunctivae normal.  Neck:     Trachea: No tracheal deviation.  Cardiovascular:     Rate and Rhythm: Normal rate and regular rhythm.  Pulmonary:     Effort: Pulmonary effort is normal. No respiratory distress.     Breath sounds: Normal breath sounds. No stridor. No wheezing or rales.  Abdominal:     General: Bowel sounds are normal. There is no distension.     Palpations: Abdomen is soft.     Tenderness: There is no abdominal tenderness. There is no guarding or rebound.  Musculoskeletal:        General: No tenderness or deformity.     Cervical back: Neck supple.  Skin:    General: Skin is warm and dry.     Findings: No rash.  Neurological:     General: No focal deficit present.     Mental Status: He is alert.     Cranial Nerves: No cranial nerve deficit (no facial droop, extraocular movements intact, no slurred speech).     Sensory: No sensory deficit.     Motor: No abnormal muscle tone or seizure activity.     Coordination: Coordination normal.  Psychiatric:        Mood and Affect: Mood normal.    ED  Results / Procedures / Treatments   Labs (all labs ordered are listed, but only abnormal results are displayed) Labs Reviewed  CBC - Abnormal; Notable for the following components:      Result Value   RBC 4.06 (*)    Hemoglobin 12.1 (*)    HCT 35.6 (*)    All other components  within normal limits  COMPREHENSIVE METABOLIC PANEL - Abnormal; Notable for the following components:   Sodium 131 (*)    Glucose, Bld 133 (*)    Calcium 8.1 (*)    Total Protein 6.2 (*)    Albumin 3.4 (*)    AST 69 (*)    ALT 69 (*)    All other components within normal limits  URINALYSIS, DIPSTICK ONLY - Abnormal; Notable for the following components:   Hgb urine dipstick SMALL (*)    Protein, ur 30 (*)    All other components within normal limits  RESP PANEL BY RT-PCR (FLU A&B, COVID) ARPGX2  LIPASE, BLOOD    EKG None  Radiology DG Chest Portable 1 View  Result Date: 11/15/2020 CLINICAL DATA:  75 year old male with fever and cough. EXAM: PORTABLE CHEST 1 VIEW COMPARISON:  None. FINDINGS: Left lung base hazy density may represent atelectasis or infiltrate. Right lung is clear. No pleural effusion pneumothorax. Borderline cardiomegaly. No acute osseous pathology. IMPRESSION: Left lung base atelectasis versus infiltrate. Electronically Signed   By: Anner Crete M.D.   On: 11/15/2020 21:49    Procedures Procedures   Medications Ordered in ED Medications  sodium chloride 0.9 % bolus 500 mL (500 mLs Intravenous Patient Refused/Not Given 11/15/20 2141)    Followed by  0.9 %  sodium chloride infusion (1,000 mLs Intravenous Patient Refused/Not Given 11/15/20 2141)  ondansetron (ZOFRAN) injection 4 mg (4 mg Intravenous Not Given 11/15/20 2141)  doxycycline (VIBRA-TABS) tablet 100 mg (has no administration in time range)    ED Course  I have reviewed the triage vital signs and the nursing notes.  Pertinent labs & imaging results that were available during my care of the patient were reviewed by me and  considered in my medical decision making (see chart for details).    MDM Rules/Calculators/A&P                           Patient presented to the ED for evaluation of myalgias cough and intermittent fevers.  Exam in the ED was reassuring.  Patient's not hypoxic.  No fever.  Blood pressure is normal.  He is not having any respiratory difficulty.  Laboratory tests do show no leukocytosis but he had mild hyponatremia and slight abnormalities in his liver function test.  Urinalysis does not show infection.  COVID and flu are negative.  Chest x-ray shows the possibility of atelectasis versus pneumonia.  Patient has been having symptoms so I will treat him for community-acquired pneumonia.  Patient refused an IV fluids.  He is not having any issues with p.o. intake so I will have him start a course of doxycycline as he was recently on amoxicillin.  Have him follow-up with his primary care doctor to be rechecked Final Clinical Impression(s) / ED Diagnoses Final diagnoses:  Community acquired pneumonia of left lower lobe of lung  Abnormal liver enzymes    Rx / DC Orders ED Discharge Orders          Ordered    ondansetron (ZOFRAN ODT) 8 MG disintegrating tablet  Every 8 hours PRN        11/15/20 2306             Dorie Rank, MD 11/15/20 2310

## 2020-11-15 NOTE — ED Notes (Signed)
Pt refused an IV and IV fluids/meds. Butterfly stick used to obtain labs.

## 2020-11-15 NOTE — ED Notes (Signed)
Pt verbalizes understanding of discharge instructions. Opportunity for questioning and answers were provided. Armand removed by staff, pt discharged from ED to home. Educated to pick up RX

## 2020-11-16 ENCOUNTER — Telehealth (HOSPITAL_BASED_OUTPATIENT_CLINIC_OR_DEPARTMENT_OTHER): Payer: Self-pay | Admitting: Emergency Medicine

## 2020-11-16 MED ORDER — DOXYCYCLINE HYCLATE 100 MG PO CAPS: 100.0000 mg | ORAL_CAPSULE | Freq: Two times a day (BID) | ORAL | 0 refills | Status: AC

## 2020-11-16 NOTE — Telephone Encounter (Signed)
Antibiotic prescription was called into pharmacy.  Discharged from ED yesterday but antibiotic was not sent.

## 2021-01-20 DIAGNOSIS — L309 Dermatitis, unspecified: Secondary | ICD-10-CM | POA: Diagnosis not present

## 2021-01-20 DIAGNOSIS — J342 Deviated nasal septum: Secondary | ICD-10-CM | POA: Diagnosis not present

## 2021-01-20 DIAGNOSIS — R040 Epistaxis: Secondary | ICD-10-CM | POA: Diagnosis not present

## 2021-01-20 DIAGNOSIS — Z5181 Encounter for therapeutic drug level monitoring: Secondary | ICD-10-CM | POA: Diagnosis not present

## 2021-01-29 DIAGNOSIS — Z23 Encounter for immunization: Secondary | ICD-10-CM | POA: Diagnosis not present

## 2021-02-06 DIAGNOSIS — Z23 Encounter for immunization: Secondary | ICD-10-CM | POA: Diagnosis not present

## 2021-02-24 DIAGNOSIS — F410 Panic disorder [episodic paroxysmal anxiety] without agoraphobia: Secondary | ICD-10-CM | POA: Diagnosis not present

## 2021-02-24 DIAGNOSIS — F411 Generalized anxiety disorder: Secondary | ICD-10-CM | POA: Diagnosis not present

## 2021-03-18 DIAGNOSIS — L719 Rosacea, unspecified: Secondary | ICD-10-CM | POA: Diagnosis not present

## 2021-03-18 DIAGNOSIS — L723 Sebaceous cyst: Secondary | ICD-10-CM | POA: Diagnosis not present

## 2021-03-18 DIAGNOSIS — F411 Generalized anxiety disorder: Secondary | ICD-10-CM | POA: Diagnosis not present

## 2021-03-18 DIAGNOSIS — F410 Panic disorder [episodic paroxysmal anxiety] without agoraphobia: Secondary | ICD-10-CM | POA: Diagnosis not present

## 2021-03-18 DIAGNOSIS — L739 Follicular disorder, unspecified: Secondary | ICD-10-CM | POA: Diagnosis not present

## 2021-03-28 DIAGNOSIS — L821 Other seborrheic keratosis: Secondary | ICD-10-CM | POA: Diagnosis not present

## 2021-03-28 DIAGNOSIS — L738 Other specified follicular disorders: Secondary | ICD-10-CM | POA: Diagnosis not present

## 2021-06-19 DIAGNOSIS — R040 Epistaxis: Secondary | ICD-10-CM | POA: Diagnosis not present

## 2021-06-19 DIAGNOSIS — J342 Deviated nasal septum: Secondary | ICD-10-CM | POA: Diagnosis not present

## 2021-07-10 DIAGNOSIS — R040 Epistaxis: Secondary | ICD-10-CM | POA: Diagnosis not present

## 2021-07-10 DIAGNOSIS — J342 Deviated nasal septum: Secondary | ICD-10-CM | POA: Diagnosis not present

## 2021-07-28 IMAGING — DX DG CHEST 1V PORT
1 series · 1 of 1 positions shown · non-contrast
Comparison: None.

CLINICAL DATA: 75-year-old male with fever and cough.

EXAM:
PORTABLE CHEST 1 VIEW

[chest]
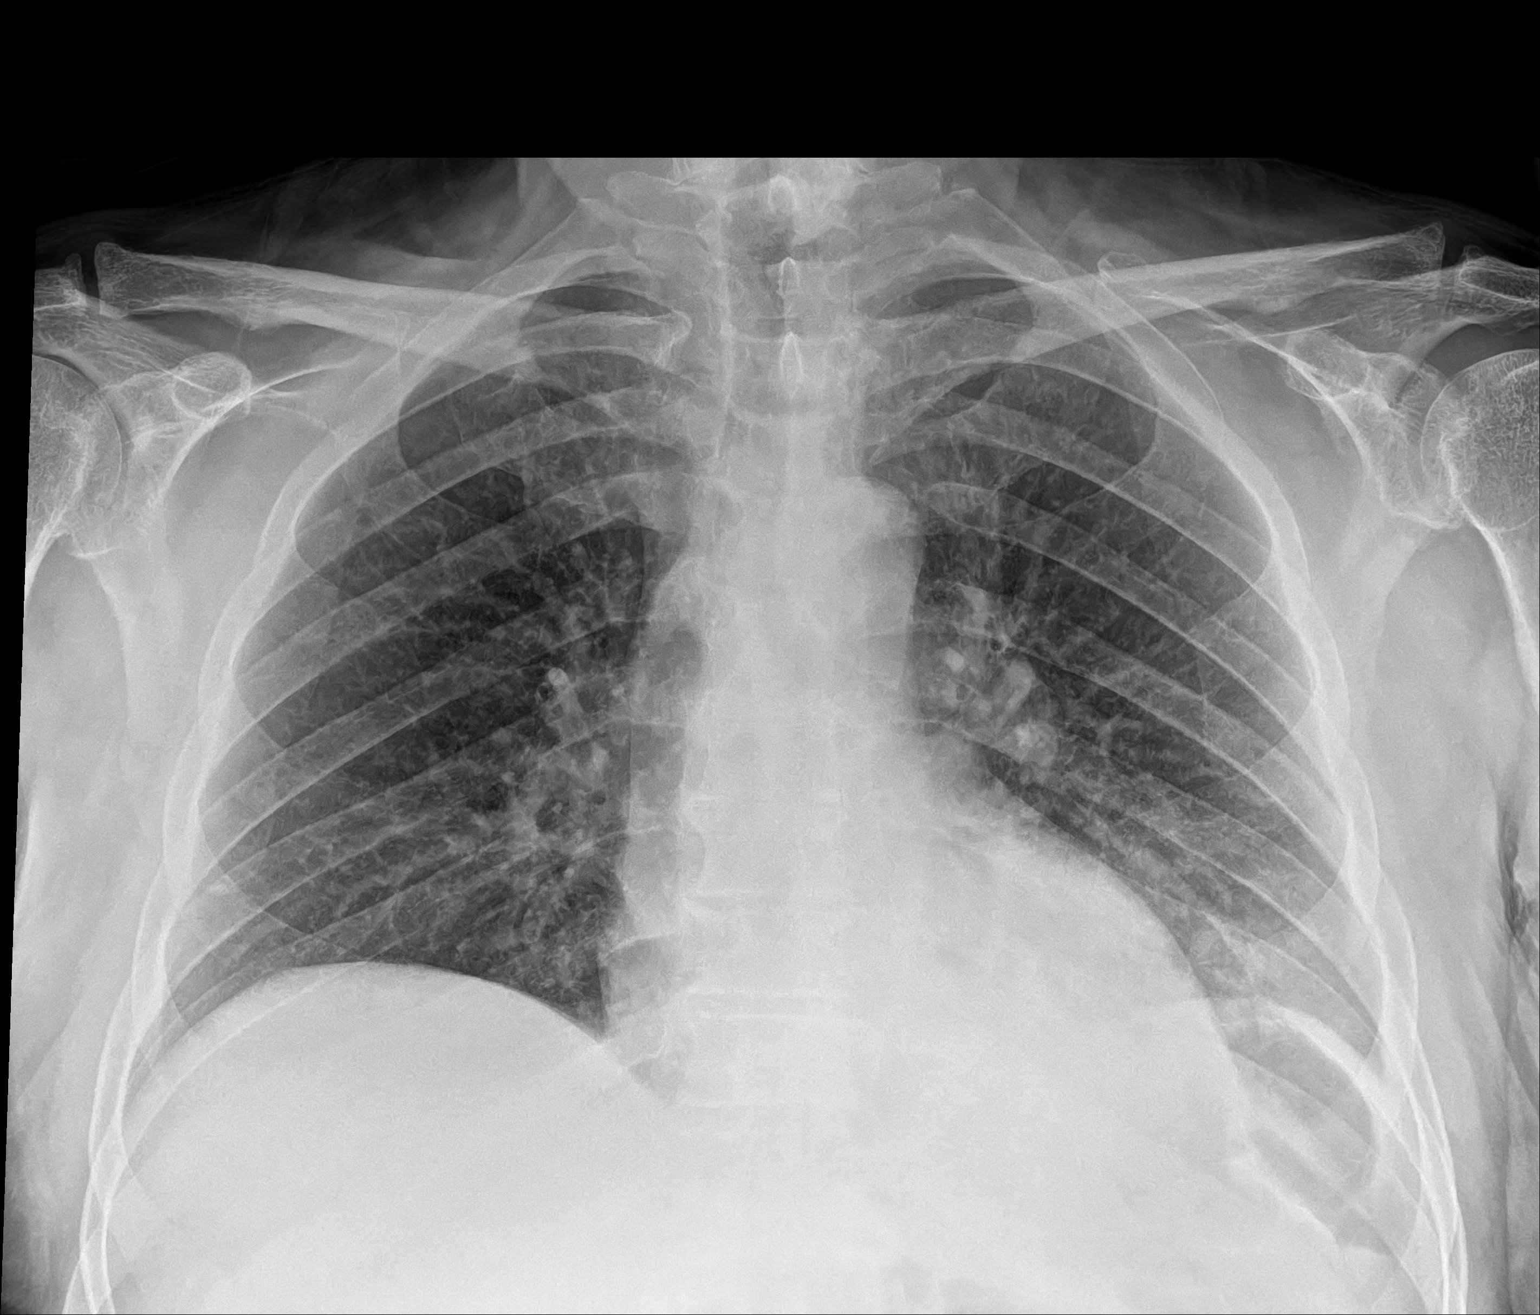

[1 of 1 positions shown; findings below may reference images not displayed]

FINDINGS: Left lung base hazy density may represent atelectasis or infiltrate.
Right lung is clear. No pleural effusion pneumothorax. Borderline
cardiomegaly. No acute osseous pathology.
IMPRESSION: Left lung base atelectasis versus infiltrate.

## 2021-08-14 DIAGNOSIS — Z5181 Encounter for therapeutic drug level monitoring: Secondary | ICD-10-CM | POA: Diagnosis not present

## 2021-08-14 DIAGNOSIS — L723 Sebaceous cyst: Secondary | ICD-10-CM | POA: Diagnosis not present

## 2021-08-14 DIAGNOSIS — E785 Hyperlipidemia, unspecified: Secondary | ICD-10-CM | POA: Diagnosis not present

## 2021-08-14 DIAGNOSIS — Z125 Encounter for screening for malignant neoplasm of prostate: Secondary | ICD-10-CM | POA: Diagnosis not present

## 2021-08-14 DIAGNOSIS — L739 Follicular disorder, unspecified: Secondary | ICD-10-CM | POA: Diagnosis not present

## 2021-08-14 DIAGNOSIS — L719 Rosacea, unspecified: Secondary | ICD-10-CM | POA: Diagnosis not present

## 2021-08-14 DIAGNOSIS — L309 Dermatitis, unspecified: Secondary | ICD-10-CM | POA: Diagnosis not present

## 2021-08-14 DIAGNOSIS — F410 Panic disorder [episodic paroxysmal anxiety] without agoraphobia: Secondary | ICD-10-CM | POA: Diagnosis not present

## 2021-08-14 DIAGNOSIS — F411 Generalized anxiety disorder: Secondary | ICD-10-CM | POA: Diagnosis not present

## 2021-08-27 DIAGNOSIS — E785 Hyperlipidemia, unspecified: Secondary | ICD-10-CM | POA: Diagnosis not present

## 2021-08-27 DIAGNOSIS — Z125 Encounter for screening for malignant neoplasm of prostate: Secondary | ICD-10-CM | POA: Diagnosis not present

## 2021-08-27 DIAGNOSIS — Z5181 Encounter for therapeutic drug level monitoring: Secondary | ICD-10-CM | POA: Diagnosis not present

## 2021-09-24 DIAGNOSIS — N35011 Post-traumatic bulbous urethral stricture: Secondary | ICD-10-CM | POA: Diagnosis not present

## 2021-09-24 DIAGNOSIS — Z8551 Personal history of malignant neoplasm of bladder: Secondary | ICD-10-CM | POA: Diagnosis not present

## 2021-09-25 DIAGNOSIS — L738 Other specified follicular disorders: Secondary | ICD-10-CM | POA: Diagnosis not present

## 2021-09-30 DIAGNOSIS — C672 Malignant neoplasm of lateral wall of bladder: Secondary | ICD-10-CM | POA: Diagnosis not present

## 2022-01-03 DIAGNOSIS — Z23 Encounter for immunization: Secondary | ICD-10-CM | POA: Diagnosis not present

## 2022-01-27 DIAGNOSIS — C672 Malignant neoplasm of lateral wall of bladder: Secondary | ICD-10-CM | POA: Diagnosis not present

## 2022-02-24 DIAGNOSIS — C672 Malignant neoplasm of lateral wall of bladder: Secondary | ICD-10-CM | POA: Diagnosis not present

## 2022-02-27 DIAGNOSIS — L218 Other seborrheic dermatitis: Secondary | ICD-10-CM | POA: Diagnosis not present

## 2022-02-27 DIAGNOSIS — L738 Other specified follicular disorders: Secondary | ICD-10-CM | POA: Diagnosis not present

## 2022-02-27 DIAGNOSIS — L280 Lichen simplex chronicus: Secondary | ICD-10-CM | POA: Diagnosis not present

## 2022-03-06 DIAGNOSIS — K219 Gastro-esophageal reflux disease without esophagitis: Secondary | ICD-10-CM | POA: Diagnosis not present

## 2022-03-06 DIAGNOSIS — L309 Dermatitis, unspecified: Secondary | ICD-10-CM | POA: Diagnosis not present

## 2022-03-06 DIAGNOSIS — F411 Generalized anxiety disorder: Secondary | ICD-10-CM | POA: Diagnosis not present

## 2022-03-06 DIAGNOSIS — Z6827 Body mass index (BMI) 27.0-27.9, adult: Secondary | ICD-10-CM | POA: Diagnosis not present

## 2022-05-27 DIAGNOSIS — C672 Malignant neoplasm of lateral wall of bladder: Secondary | ICD-10-CM | POA: Diagnosis not present

## 2022-05-27 DIAGNOSIS — N35011 Post-traumatic bulbous urethral stricture: Secondary | ICD-10-CM | POA: Diagnosis not present

## 2022-05-28 ENCOUNTER — Other Ambulatory Visit: Payer: Self-pay | Admitting: Urology

## 2022-06-19 ENCOUNTER — Encounter (HOSPITAL_BASED_OUTPATIENT_CLINIC_OR_DEPARTMENT_OTHER): Payer: Self-pay | Admitting: Urology

## 2022-06-19 NOTE — Progress Notes (Signed)
Spoke w/ via phone for pre-op interview--- pt's daughter, Kalman Shan.  Pt speaks [place]----  no             Lab results------ no COVID test -----patient states asymptomatic no test needed Arrive at ------- 0530 on 06-23-2022 NPO after MN with exception sips of water w/ meds Med rec completed Medications to take morning of surgery ----- celexa, zyrtec, prilosec Diabetic medication ----- n/a Patient instructed no nail polish to be worn day of surgery Patient instructed to bring photo id and insurance card day of surgery Patient aware to have Driver (ride ) / caregiver    for 24 hours after surgery ---daughter, Kalman Shan Patient Special Instructions ----- n/a Pre-Op special Istructions -----  pt daughter will be interpreting for pt dos and aware of needed to sign waiver Patient verbalized understanding of instructions that were given at this phone interview. Patient denies shortness of breath, chest pain, fever, cough at this phone interview.

## 2022-06-22 NOTE — H&P (Signed)
I had bladder cancer that has been treated.  HPI: Ralph Rodgers is a 77 year-old male established patient who is here for follow-up of bladder cancer.  His bladder cancer was superficial and limitied to the bladder lining. His bladder cancer was not muscle invasive.   He did have a TURBT. His last bladder tumor was resected 09/30/2021. He did not have radiation to treat his bladder cancer. He had treatment with the following intravesical agents: BCG and Gemcitabine. Patient denies Mitomycin, Interferon, Adriamycin, and Epirubicin.   He has not had blood in his urine recently. The patient denies any progressive voiding symptoms. He is not having new bone pain. He has not recently had unwanted weight loss.   His last radiologic test to evaluate the kidneys was 10/30/2016.   05/27/22: Ralph Rodgers returns today in f/u for surveillance cystoscopy. He had fulguration in the office of a small recurrence on 02/24/22. His UA is clear and his IPSS is 1 with nocturia x 1. He has had no hematuria.   01/27/22: Ralph Rodgers returns to for cystoscopy for surveillance of his history of LG NMBIC with fulguration of a 2-46m lesion on 09/30/21. He has been doing well with minimal LUTS and an IPSS of 1 with nocturia x 1. His UA is clear.   History of bladder cancer: [place]adder [place] stage[place] and BCG x 6. Had been getting annual surveillance cystoscopy without evidence of recurrence until 11/17 when he was noted to have a urethral stricture that was unpassable and the procedure was aborted. [place]  [place] scan 7/18 - no abnormality of the upper tract, extravesical extension or adenopathy with a 1.4 cm mucosal lesion on the left wall bladder.  TURBT 01/23/17: He was also found to have a urethral stricture that required balloon dilatation.  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  Pathology: Low-grade, superficial, papillary TCCa (Ta,G1)  Cystoscopy with fulguration 09/30/21: 2-369mpapillary lesion. No biopsy.      ALLERGIES: None   MEDICATIONS: Prilosec  Zyrtec 10 mg capsule  Citalopram Hbr 20 mg tablet     GU PSH: Bladder Instill AntiCA Agent - 2019 Cysto Dilate Stricture (M or F) - 2019, 2019, 2019, 2019, 2019, 2018 Cysto Fulgurate < 0.5 cm - 2021 Cystoscopy - 01/27/2022, 09/24/2021, 09/02/2020, 2021, 2021, 2020, 2020, 2020, 2018 Cystoscopy Fulguration - 02/24/2022, 09/30/2021, 2021 Cystoscopy TURBT <2 cm - 2019, 2018 Cystoscopy TURBT 2-5 cm - 2006     NON-GU PSH: Appendectomy (open) - 1994 Cataract surgery, Bilateral     GU PMH: Bladder Cancer Lateral, I was unable to get an angle on the lesion for a biopsy but did a generous fulguration. If it recurs, I will want to take him to the OR for a more definitive procedure. - 02/24/2022, HE has a small recurrent tumor on the left lateral wall. I discussed options with the patient and his daughter and will have him return for office biopsy and fulguration. , - 01/27/2022, He will return in 3 months for cystoscopy. , - 09/30/2021 (Stable), - 09/24/2021, He was found to have a small papillary recurrence on the left wall the bladder. This was treated with fulguration today. Because of this recurrence I have recommended that he return in 3 months for repeat surveillance cystoscopy., - 2021 Bulbar urethral stricture, He has a stable mild mid bulbar  stricture. - 02/24/2022, He has a persistent mild stricture. , - 01/27/2022, disrupted with the scope. , - 09/24/2021, He has a mild mid bulbar stricture but the scope passed without dilation. , - 09/02/2020, - 2018 History of bladder cancer - 09/30/2021, He has a small recurrent tumor adjacent to a prior resection site. I think this can be managed with fulguration in the office. He has had that done before and is agreeable to that approach. I  reviewed the risks of bleeding and infection. , - 09/24/2021, He has no recurrent tumors. He will return in a year for cystoscopy., - 09/02/2020, He had no evidence of recurrent transitional cell carcinoma the bladder. He had a recurrence within a year and therefore he was considered intermediate risk. He now can proceed to cystoscopy in 6 months., - 2021 (Stable), He had no evidence of recurrence. He will return again in 3 months for surveillance cystoscopy., - 2020 (Stable), He will return in 3 months for surveillance cystoscopy., - 2019 (Stable), He had no evidence of recurrent transitional cell carcinoma noted of the bladder on cystoscopy today. He will return in 3 months for repeat surveillance cystoscopy., - 2019 (Stable), He had no evidence of recurrent transitional cell carcinoma of the bladder. He will continue surveillance cystoscopy., - 2019 (Stable), He had no evidence of recurrent transitional cell carcinoma of the bladder today. I am going to send urine for cytology and will tentatively plan to see him back in 3 months for repeat surveillance cystoscopy., - 2019 Bladder Cancer Posterior, He was found to have a small recurrence that appears very superficial on the posterior wall of the bladder on the left-hand side near the dome. He contacted his daughter and she served as an Astronomer and I informed him of the need to treat this as an outpatient with transurethral resection and placement of intravesical chemotherapy postoperatively. - 2019    NON-GU PMH: Anxiety GERD    FAMILY HISTORY: 1 Daughter - Daughter 1 son - Son Death In The Family Father - Runs in Family   SOCIAL HISTORY: Marital Status: Married Preferred Language: [place]; Ethnicity: Not Hispanic Or Latino Current Smoking Status: Patient does not smoke anymore. Has not smoked since 12/03/1980.   Tobacco Use Assessment Completed: Used Tobacco in last 30 days? Does not use smokeless tobacco. Has never drank.  Does not use  drugs. Drinks 2 caffeinated drinks per day.    REVIEW OF SYSTEMS:    GU Review Male:   Patient denies frequent urination, hard to postpone urination, burning/ pain with urination, get up at night to urinate, leakage of urine, stream starts and stops, trouble starting your stream, have to strain to urinate , erection problems, and penile pain.  Gastrointestinal (Upper):   Patient denies nausea, vomiting, and indigestion/ heartburn.  Gastrointestinal (Lower):   Patient denies diarrhea and constipation.  Constitutional:   Patient denies fever, night sweats, weight loss, and fatigue.  Skin:   Patient denies skin rash/ lesion and itching.  Eyes:   Patient denies blurred vision and double vision.  Ears/ Nose/ Throat:   Patient denies sore throat and sinus problems.  Hematologic/Lymphatic:   Patient denies swollen glands and easy bruising.  Cardiovascular:   Patient denies chest pains and leg swelling.  Respiratory:   Patient denies cough and shortness of breath.  Endocrine:   Patient denies excessive thirst.  Musculoskeletal:   Patient denies back pain and joint pain.  Neurological:   Patient denies headaches and dizziness.  Psychologic:  Patient denies depression and anxiety.   VITAL SIGNS: None   MULTI-SYSTEM PHYSICAL EXAMINATION:    Constitutional: Well-nourished. No physical deformities. Normally developed. Good grooming.  Respiratory: Normal breath sounds. No labored breathing, no use of accessory muscles.   Cardiovascular: Regular rate and rhythm. No murmur, no gallop.      Complexity of Data:  Records Review:   AUA Symptom Score, Previous Patient Records  Urine Test Review:   Urinalysis   08/27/21 03/04/18 03/10/17  PSA  Total PSA 1.25 ng/ml 1.27 ng/dl 0.84 ng/dl    PROCEDURES:         Flexible Cystoscopy - 52000  Risks, benefits, and some of the potential complications of the procedure were discussed. He was prepped with betadine and the urethral was instilled with 2%  lidocaine jelly. 92m of 2% lidocaine jelly was instilled intraurethrally.     Meatus:  Normal size. Normal location. Normal condition.  Urethra:  Mild bulbous stricture.  External Sphincter:  Normal.  Verumontanum:  Normal.  Prostate:  Borderline obstructing. Moderate hyperplasia.  Bladder Neck:  Non-obstructing.  Ureteral Orifices:  Normal location. Normal size. Normal shape. Effluxed clear urine.  Bladder:  Mild trabeculation. A few left lateral wall tumors, 3-4 between 1-330min size. Normal mucosa. No stones.      The procedure was well tolerated and there were no complications.         Urinalysis Dipstick Dipstick Cont'd  Color: Yellow Bilirubin: Neg mg/dL  Appearance: Clear Ketones: Neg mg/dL  Specific Gravity: 1.020 Blood: Neg ery/uL  pH: 5.5 Protein: Neg mg/dL  Glucose: Neg mg/dL Urobilinogen: 0.2 mg/dL    Nitrites: Neg    Leukocyte Esterase: Neg leu/uL    ASSESSMENT:      ICD-10 Details  1 GU:   Bladder Cancer Lateral - C67.2 Chronic, Worsening - He has 3-4 very small papillary recurrences. I will get him set up for cystoscopy with biopsy, fulguration and gemcitabine along with BRTG's and possible urethral dilation. Risks reviewed in detail.   2   Bulbar urethral stricture - N35.011 Minor - Mild with minor resistance to scope passage.    PLAN:           Schedule Return Visit/Planned Activity: Next Available Appointment - Schedule Surgery

## 2022-06-23 ENCOUNTER — Ambulatory Visit (HOSPITAL_BASED_OUTPATIENT_CLINIC_OR_DEPARTMENT_OTHER): Payer: Medicare Other | Admitting: Anesthesiology

## 2022-06-23 ENCOUNTER — Other Ambulatory Visit: Payer: Self-pay

## 2022-06-23 ENCOUNTER — Encounter (HOSPITAL_BASED_OUTPATIENT_CLINIC_OR_DEPARTMENT_OTHER): Payer: Self-pay | Admitting: Urology

## 2022-06-23 ENCOUNTER — Ambulatory Visit (HOSPITAL_BASED_OUTPATIENT_CLINIC_OR_DEPARTMENT_OTHER)
Admission: RE | Admit: 2022-06-23 | Discharge: 2022-06-23 | Disposition: A | Discharge status: Home / Self Care | Payer: Medicare Other | Attending: Urology | Admitting: Urology

## 2022-06-23 ENCOUNTER — Encounter (HOSPITAL_BASED_OUTPATIENT_CLINIC_OR_DEPARTMENT_OTHER)
Admission: RE | Disposition: A | Discharge status: Home / Self Care | Payer: Self-pay | Source: Home / Self Care | Attending: Urology

## 2022-06-23 DIAGNOSIS — Z01818 Encounter for other preprocedural examination: Secondary | ICD-10-CM

## 2022-06-23 DIAGNOSIS — D494 Neoplasm of unspecified behavior of bladder: Secondary | ICD-10-CM

## 2022-06-23 DIAGNOSIS — C672 Malignant neoplasm of lateral wall of bladder: Secondary | ICD-10-CM | POA: Insufficient documentation

## 2022-06-23 DIAGNOSIS — Z87891 Personal history of nicotine dependence: Secondary | ICD-10-CM | POA: Diagnosis not present

## 2022-06-23 DIAGNOSIS — K219 Gastro-esophageal reflux disease without esophagitis: Secondary | ICD-10-CM | POA: Diagnosis not present

## 2022-06-23 HISTORY — PX: CYSTOSCOPY WITH BIOPSY: SHX5122

## 2022-06-23 HISTORY — PX: CYSTOSCOPY WITH URETHRAL DILATATION: SHX5125

## 2022-06-23 HISTORY — DX: Generalized anxiety disorder: F41.1

## 2022-06-23 HISTORY — DX: Unspecified urethral stricture, male, unspecified site: N35.919

## 2022-06-23 HISTORY — DX: Dermatitis, unspecified: L30.9

## 2022-06-23 SURGERY — CYSTOSCOPY, WITH BIOPSY
Anesthesia: General | Site: Bladder

## 2022-06-23 MED ORDER — PROPOFOL 10 MG/ML IV BOLUS
INTRAVENOUS | Status: DC | PRN
  Administered 2022-06-23: 100 mg via INTRAVENOUS

## 2022-06-23 MED ORDER — ROCURONIUM BROMIDE 10 MG/ML (PF) SYRINGE
PREFILLED_SYRINGE | INTRAVENOUS | Status: AC
  Filled 2022-06-23: qty 10

## 2022-06-23 MED ORDER — GEMCITABINE CHEMO FOR BLADDER INSTILLATION 2000 MG
2000.0000 mg | Freq: Once | INTRAVENOUS | Status: AC
  Administered 2022-06-23: 2000 mg via INTRAVESICAL
  Filled 2022-06-23: qty 2000

## 2022-06-23 MED ORDER — STERILE WATER FOR IRRIGATION IR SOLN
Status: DC | PRN
  Administered 2022-06-23: 3000 mL

## 2022-06-23 MED ORDER — SODIUM CHLORIDE 0.9 % IV SOLN: 250.0000 mL | INTRAVENOUS | Status: DC | PRN

## 2022-06-23 MED ORDER — MORPHINE SULFATE (PF) 4 MG/ML IV SOLN: 2.0000 mg | INTRAVENOUS | Status: DC | PRN

## 2022-06-23 MED ORDER — SODIUM CHLORIDE 0.9% FLUSH: 3.0000 mL | Freq: Two times a day (BID) | INTRAVENOUS | Status: DC

## 2022-06-23 MED ORDER — PROPOFOL 10 MG/ML IV BOLUS
INTRAVENOUS | Status: AC
  Filled 2022-06-23: qty 20

## 2022-06-23 MED ORDER — PHENYLEPHRINE 80 MCG/ML (10ML) SYRINGE FOR IV PUSH (FOR BLOOD PRESSURE SUPPORT)
PREFILLED_SYRINGE | INTRAVENOUS | Status: DC | PRN
  Administered 2022-06-23: 160 ug via INTRAVENOUS
  Administered 2022-06-23: 80 ug via INTRAVENOUS

## 2022-06-23 MED ORDER — OXYCODONE HCL 5 MG PO TABS: 5.0000 mg | ORAL_TABLET | ORAL | Status: DC | PRN

## 2022-06-23 MED ORDER — LIDOCAINE HCL (PF) 2 % IJ SOLN
INTRAMUSCULAR | Status: AC
  Filled 2022-06-23: qty 5

## 2022-06-23 MED ORDER — CEFAZOLIN SODIUM-DEXTROSE 2-4 GM/100ML-% IV SOLN
2.0000 g | INTRAVENOUS | Status: AC
  Administered 2022-06-23: 2 g via INTRAVENOUS

## 2022-06-23 MED ORDER — FENTANYL CITRATE (PF) 100 MCG/2ML IJ SOLN
INTRAMUSCULAR | Status: AC
  Filled 2022-06-23: qty 2

## 2022-06-23 MED ORDER — ACETAMINOPHEN 325 MG RE SUPP: 650.0000 mg | RECTAL | Status: DC | PRN

## 2022-06-23 MED ORDER — FENTANYL CITRATE (PF) 100 MCG/2ML IJ SOLN
25.0000 ug | INTRAMUSCULAR | Status: DC | PRN
  Administered 2022-06-23: 25 ug via INTRAVENOUS

## 2022-06-23 MED ORDER — ACETAMINOPHEN 325 MG PO TABS: 650.0000 mg | ORAL_TABLET | ORAL | Status: DC | PRN

## 2022-06-23 MED ORDER — LACTATED RINGERS IV SOLN: INTRAVENOUS | Status: DC

## 2022-06-23 MED ORDER — IOHEXOL 300 MG/ML  SOLN
INTRAMUSCULAR | Status: DC | PRN
  Administered 2022-06-23: 15 mL via URETHRAL

## 2022-06-23 MED ORDER — SODIUM CHLORIDE 0.9% FLUSH: 3.0000 mL | INTRAVENOUS | Status: DC | PRN

## 2022-06-23 MED ORDER — ONDANSETRON HCL 4 MG/2ML IJ SOLN
INTRAMUSCULAR | Status: DC | PRN
  Administered 2022-06-23: 4 mg via INTRAVENOUS

## 2022-06-23 MED ORDER — DEXAMETHASONE SODIUM PHOSPHATE 10 MG/ML IJ SOLN
INTRAMUSCULAR | Status: DC | PRN
  Administered 2022-06-23: 5 mg via INTRAVENOUS

## 2022-06-23 MED ORDER — SUGAMMADEX SODIUM 200 MG/2ML IV SOLN
INTRAVENOUS | Status: DC | PRN
  Administered 2022-06-23: 200 mg via INTRAVENOUS

## 2022-06-23 MED ORDER — CEFAZOLIN SODIUM-DEXTROSE 2-4 GM/100ML-% IV SOLN
INTRAVENOUS | Status: AC
  Filled 2022-06-23: qty 100

## 2022-06-23 MED ORDER — LIDOCAINE 2% (20 MG/ML) 5 ML SYRINGE
INTRAMUSCULAR | Status: DC | PRN
  Administered 2022-06-23: 60 mg via INTRAVENOUS

## 2022-06-23 MED ORDER — ROCURONIUM BROMIDE 10 MG/ML (PF) SYRINGE
PREFILLED_SYRINGE | INTRAVENOUS | Status: DC | PRN
  Administered 2022-06-23: 50 mg via INTRAVENOUS

## 2022-06-23 MED ORDER — ACETAMINOPHEN 500 MG PO TABS
ORAL_TABLET | ORAL | Status: AC
  Filled 2022-06-23: qty 2

## 2022-06-23 MED ORDER — ACETAMINOPHEN 500 MG PO TABS
1000.0000 mg | ORAL_TABLET | Freq: Once | ORAL | Status: AC
  Administered 2022-06-23: 1000 mg via ORAL

## 2022-06-23 MED ORDER — FENTANYL CITRATE (PF) 100 MCG/2ML IJ SOLN
INTRAMUSCULAR | Status: DC | PRN
  Administered 2022-06-23: 50 ug via INTRAVENOUS

## 2022-06-23 SURGICAL SUPPLY — 21 items
BAG DRAIN URO-CYSTO SKYTR STRL (DRAIN) ×2 IMPLANT
BAG DRN RND TRDRP ANRFLXCHMBR (UROLOGICAL SUPPLIES) ×2
BAG DRN UROCATH (DRAIN) ×2
BAG URINE DRAIN 2000ML AR STRL (UROLOGICAL SUPPLIES) IMPLANT
CATH FOLEY 2WAY SLVR  5CC 18FR (CATHETERS) ×2
CATH FOLEY 2WAY SLVR 5CC 18FR (CATHETERS) IMPLANT
CATH URETL OPEN 5X70 (CATHETERS) IMPLANT
CLOTH BEACON ORANGE TIMEOUT ST (SAFETY) ×4 IMPLANT
ELECT REM PT RETURN 9FT ADLT (ELECTROSURGICAL) ×2
ELECTRODE REM PT RTRN 9FT ADLT (ELECTROSURGICAL) ×2 IMPLANT
GLOVE SURG SS PI 6.5 STRL IVOR (GLOVE) IMPLANT
GLOVE SURG SS PI 8.0 STRL IVOR (GLOVE) ×2 IMPLANT
GOWN STRL REUS W/ TWL LRG LVL3 (GOWN DISPOSABLE) IMPLANT
GOWN STRL REUS W/TWL LRG LVL3 (GOWN DISPOSABLE) ×6 IMPLANT
HOLDER FOLEY CATH W/STRAP (MISCELLANEOUS) IMPLANT
KIT TURNOVER CYSTO (KITS) ×2 IMPLANT
MANIFOLD NEPTUNE II (INSTRUMENTS) ×2 IMPLANT
PACK CYSTO (CUSTOM PROCEDURE TRAY) ×2 IMPLANT
SLEEVE SCD COMPRESS KNEE MED (STOCKING) ×2 IMPLANT
TUBE CONNECTING 12X1/4 (SUCTIONS) ×2 IMPLANT
WATER STERILE IRR 3000ML UROMA (IV SOLUTION) ×2 IMPLANT

## 2022-06-23 NOTE — Op Note (Signed)
Procedure: 1.  Cystoscopy with bilateral retrograde pyelograms and interpretation. 2.  Cystoscopy with biopsy and fulguration of recurrent bladder tumors. 3.  Application of fluoroscopy. 4.  Instillation of gemcitabine in PACU.  Preop diagnosis: Recurrent bladder tumors of overlapping sites.  Postop diagnosis: Same.  Surgeon: Dr. Irine Seal.  Anesthesia: General.  Specimen: Biopsies from the left lateral wall and trigone.  Drains: 52 French Foley catheter.  EBL: None.  Complications: None.  Indications: The patient is a 77 year old male with a history of recurrent urothelial carcinoma that had been managed in the in the office with fulguration of small recurrences but on his most recent cystoscopy he had multiple small tumors that were primarily adjacent to the resection scar on the left lateral wall but extended from the left trigone onto the left lateral wall and area approximately 1.5 centimeters in greatest diameter and there was a second area superior to the resection scar approximately 5 mm in diameter.  It was felt that biopsy with fulguration, bilateral retrograde pyelography and instillation of gemcitabine were indicated.  Procedure: He was taken operating room was given Ancef.  General anesthetic was induced.  He was placed in lithotomy position and fitted with PAS hose.  His perineum and genitalia were prepped Betadine solution was draped in usual sterile fashion.  Cystoscopy was performed using a 21 [place] scope and 30 degree lens.  Examination really normal urethra with the exception of a mild mid bulbar stricture that did not prevent passage of the scope.  The external sphincter was intact.  The prostatic urethra short with some lateral lobe enlargement but minimal obstruction.  Examination of bladder revealed mild trabeculation.  There were 2 stellate scars on the left lateral wall consistent with prior resection sites and adjacent to the larger which was close to the trigone  where several small tumors measuring 2 to 3 mm in size and an area approximately 1 x 1-1/2 cm just lateral to the left ureteral orifice and a second it was about 3 x 5 mm lateral to the resection scar.  A cup biopsy forceps was used to take biopsies from the left trigone and from both sides of the resection scar.  The biopsy sites were then generously fulgurated with the Bugbee electrode limiting the entire patch of tumors and area approximately 1 x 1.5 cm lateral to the left ureteral orifice and an area approximately 5 x 8 mm lateral to the resection scar.  Once adequate hemostasis had been achieved and inspection revealed no bladder wall perforation and proceeded with retrograde pyelography.  Retrograde pyelography was performed using a 5 [place] open-ended catheter and Omnipaque.  The right retrograde pyelogram revealed a normal ureter and intrarenal collecting system without filling defects.  The left retrograde pyelogram demonstrated a normal ureter and intrarenal collecting system without filling defects.  The cystoscope was then removed and an 68 French Foley catheter was inserted.  The balloon was filled with 10 mL of sterile fluid and the catheter was placed to straight drainage.  He was taken down from lithotomy position, his anesthetic was reversed and he was moved to recovery room in stable condition  In the recovery room his bladder was instilled with 2000 mg of gemcitabine and 50 mL of diluent.  The catheter was plugged and the gemcitabine was left indwelling for an hour before the bladder was drained.  The catheter was removed after drainage.  There were no complications

## 2022-06-23 NOTE — Anesthesia Postprocedure Evaluation (Signed)
Anesthesia Post Note  Patient: Ralph Rodgers  Procedure(s) Performed: CYSTOSCOPY WITH BLADDER BIOPSY FULGURATION  INSTILL GEMCITABINE (Bladder) CYSTOSCOPY WITH BILATERAL RETROGRADE (Bilateral)     Patient location during evaluation: PACU Anesthesia Type: General Level of consciousness: awake and alert Pain management: pain level controlled Vital Signs Assessment: post-procedure vital signs reviewed and stable Respiratory status: spontaneous breathing, nonlabored ventilation, respiratory function stable and patient connected to nasal cannula oxygen Cardiovascular status: blood pressure returned to baseline and stable Postop Assessment: no apparent nausea or vomiting Anesthetic complications: no  No notable events documented.  Last Vitals:  Vitals:   06/23/22 0945 06/23/22 1000  BP: (!) 151/76 (!) 151/68  Pulse: 62 62  Resp: 17 16  Temp:  (!) 36.2 C  SpO2: 98% 96%    Last Pain:  Vitals:   06/23/22 1000  TempSrc: Oral  PainSc:                  Jamael Hoffmann L Kailene Steinhart

## 2022-06-23 NOTE — Anesthesia Procedure Notes (Signed)
Procedure Name: Intubation Date/Time: 06/23/2022 7:34 AM  Performed by: Rogers Blocker, CRNAPre-anesthesia Checklist: Patient identified, Emergency Drugs available, Suction available and Patient being monitored Patient Re-evaluated:Patient Re-evaluated prior to induction Oxygen Delivery Method: Circle System Utilized Preoxygenation: Pre-oxygenation with 100% oxygen Induction Type: IV induction Ventilation: Mask ventilation without difficulty Laryngoscope Size: Mac and 4 Grade View: Grade I Tube type: Oral Tube size: 7.5 mm Number of attempts: 1 Airway Equipment and Method: Stylet and Bite block Placement Confirmation: ETT inserted through vocal cords under direct vision, positive ETCO2 and breath sounds checked- equal and bilateral Secured at: 22 cm Tube secured with: Tape Dental Injury: Teeth and Oropharynx as per pre-operative assessment

## 2022-06-23 NOTE — Transfer of Care (Signed)
Immediate Anesthesia Transfer of Care Note  Patient: Ralph Rodgers  Procedure(s) Performed: CYSTOSCOPY WITH BLADDER BIOPSY FULGURATION  INSTILL GEMCITABINE (Bladder) CYSTOSCOPY WITH BILATERAL RETROGRADE (Bilateral)  Patient Location: PACU  Anesthesia Type:General  Level of Consciousness: awake, alert , oriented, and patient cooperative  Airway & Oxygen Therapy: Patient Spontanous Breathing  Post-op Assessment: Report given to RN and Post -op Vital signs reviewed and stable  Post vital signs: Reviewed and stable  Last Vitals:  Vitals Value Taken Time  BP 142/73 06/23/22 0813  Temp 36.2 C 06/23/22 0813  Pulse 71 06/23/22 0814  Resp 14 06/23/22 0814  SpO2 97 % 06/23/22 0814  Vitals shown include unvalidated device data.  Last Pain:  Vitals:   06/23/22 0603  TempSrc: Oral  PainSc: 0-No pain      Patients Stated Pain Goal: 4 ([id number])  Complications: No notable events documented.

## 2022-06-23 NOTE — Interval H&P Note (Signed)
History and Physical Interval Note:  06/23/2022 7:18 AM  Ralph Rodgers  has presented today for surgery, with the diagnosis of BLADDER TUMOR  URETHRAL STRICTURE.  The various methods of treatment have been discussed with the patient and family. After consideration of risks, benefits and other options for treatment, the patient has consented to  Procedure(s) with comments: CYSTOSCOPY WITH BLADDER BIOPSY FULGURATION  INSTILL GEMCITABINE (N/A) CYSTOSCOPY WITH BILATERAL RETROGRADE POSSIBLE URETHRAL DILATATION (Bilateral) - 1 HR FOR CASE as a surgical intervention.  The patient's history has been reviewed, patient examined, no change in status, stable for surgery.  I have reviewed the patient's chart and labs.  Questions were answered to the patient's satisfaction.     Irine Seal

## 2022-06-23 NOTE — Discharge Instructions (Addendum)
-  No acetaminophen/Tylenol until after 1:03 pm today if needed.   Post Anesthesia Home Care Instructions  Activity: Get plenty of rest for the remainder of the day. A responsible individual must stay with you for 24 hours following the procedure.  For the next 24 hours, DO NOT: -Drive a car -Paediatric nurse -Drink alcoholic beverages -Take any medication unless instructed by your physician -Make any legal decisions or sign important papers.  Meals: Start with liquid foods such as gelatin or soup. Progress to regular foods as tolerated. Avoid greasy, spicy, heavy foods. If nausea and/or vomiting occur, drink only clear liquids until the nausea and/or vomiting subsides. Call your physician if vomiting continues.  Special Instructions/Symptoms: Your throat may feel dry or sore from the anesthesia or the breathing tube placed in your throat during surgery. If this causes discomfort, gargle with warm salt water. The discomfort should disappear within 24 hours.

## 2022-06-23 NOTE — Anesthesia Preprocedure Evaluation (Signed)
Anesthesia Evaluation  Patient identified by MRN, date of birth, ID band Patient awake    Reviewed: Allergy & Precautions, NPO status , Patient's Chart, lab work & pertinent test results  Airway Mallampati: II  TM Distance: >3 FB Neck ROM: Full    Dental no notable dental hx.    Pulmonary neg pulmonary ROS, former smoker   Pulmonary exam normal breath sounds clear to auscultation       Cardiovascular negative cardio ROS Normal cardiovascular exam Rhythm:Regular Rate:Normal     Neuro/Psych  PSYCHIATRIC DISORDERS Anxiety     negative neurological ROS     GI/Hepatic Neg liver ROS,GERD  ,,  Endo/Other  negative endocrine ROS    Renal/GU negative Renal ROS  negative genitourinary   Musculoskeletal negative musculoskeletal ROS (+)    Abdominal   Peds  Hematology negative hematology ROS (+)   Anesthesia Other Findings Bladder CA  Reproductive/Obstetrics                             Anesthesia Physical Anesthesia Plan  ASA: 2  Anesthesia Plan: General   Post-op Pain Management: Tylenol PO (pre-op)*   Induction: Intravenous  PONV Risk Score and Plan: 2 and Dexamethasone, Ondansetron and Treatment may vary due to age or medical condition  Airway Management Planned: Oral ETT and LMA  Additional Equipment:   Intra-op Plan:   Post-operative Plan: Extubation in OR  Informed Consent: I have reviewed the patients History and Physical, chart, labs and discussed the procedure including the risks, benefits and alternatives for the proposed anesthesia with the patient or authorized representative who has indicated his/her understanding and acceptance.     Dental advisory given  Plan Discussed with: CRNA  Anesthesia Plan Comments:        Anesthesia Quick Evaluation

## 2022-06-24 ENCOUNTER — Encounter (HOSPITAL_BASED_OUTPATIENT_CLINIC_OR_DEPARTMENT_OTHER): Payer: Self-pay | Admitting: Urology

## 2022-06-24 LAB — SURGICAL PATHOLOGY

## 2022-07-06 DIAGNOSIS — N35011 Post-traumatic bulbous urethral stricture: Secondary | ICD-10-CM | POA: Diagnosis not present

## 2022-07-06 DIAGNOSIS — C672 Malignant neoplasm of lateral wall of bladder: Secondary | ICD-10-CM | POA: Diagnosis not present

## 2022-08-07 DIAGNOSIS — L738 Other specified follicular disorders: Secondary | ICD-10-CM | POA: Diagnosis not present

## 2022-09-22 ENCOUNTER — Other Ambulatory Visit: Payer: Self-pay | Admitting: Family Medicine

## 2022-09-22 DIAGNOSIS — F411 Generalized anxiety disorder: Secondary | ICD-10-CM | POA: Diagnosis not present

## 2022-09-22 DIAGNOSIS — Z136 Encounter for screening for cardiovascular disorders: Secondary | ICD-10-CM | POA: Diagnosis not present

## 2022-09-22 DIAGNOSIS — L309 Dermatitis, unspecified: Secondary | ICD-10-CM | POA: Diagnosis not present

## 2022-09-22 DIAGNOSIS — K219 Gastro-esophageal reflux disease without esophagitis: Secondary | ICD-10-CM | POA: Diagnosis not present

## 2022-09-22 DIAGNOSIS — E785 Hyperlipidemia, unspecified: Secondary | ICD-10-CM | POA: Diagnosis not present

## 2022-09-22 DIAGNOSIS — Z125 Encounter for screening for malignant neoplasm of prostate: Secondary | ICD-10-CM | POA: Diagnosis not present

## 2022-09-22 DIAGNOSIS — Z0000 Encounter for general adult medical examination without abnormal findings: Secondary | ICD-10-CM | POA: Diagnosis not present

## 2022-10-20 ENCOUNTER — Ambulatory Visit
Admission: RE | Admit: 2022-10-20 | Discharge: 2022-10-20 | Disposition: A | Discharge status: Home / Self Care | Payer: Medicare Other | Source: Ambulatory Visit | Attending: Family Medicine | Admitting: Family Medicine

## 2022-10-20 DIAGNOSIS — Z136 Encounter for screening for cardiovascular disorders: Secondary | ICD-10-CM | POA: Diagnosis not present

## 2022-10-20 DIAGNOSIS — Z87891 Personal history of nicotine dependence: Secondary | ICD-10-CM | POA: Diagnosis not present

## 2022-11-02 DIAGNOSIS — N35011 Post-traumatic bulbous urethral stricture: Secondary | ICD-10-CM | POA: Diagnosis not present

## 2022-11-02 DIAGNOSIS — Z8551 Personal history of malignant neoplasm of bladder: Secondary | ICD-10-CM | POA: Diagnosis not present

## 2023-02-16 DIAGNOSIS — Z23 Encounter for immunization: Secondary | ICD-10-CM | POA: Diagnosis not present

## 2023-04-08 DIAGNOSIS — L738 Other specified follicular disorders: Secondary | ICD-10-CM | POA: Diagnosis not present

## 2023-05-25 DIAGNOSIS — Z6828 Body mass index (BMI) 28.0-28.9, adult: Secondary | ICD-10-CM | POA: Diagnosis not present

## 2023-05-25 DIAGNOSIS — F410 Panic disorder [episodic paroxysmal anxiety] without agoraphobia: Secondary | ICD-10-CM | POA: Diagnosis not present

## 2023-05-25 DIAGNOSIS — K219 Gastro-esophageal reflux disease without esophagitis: Secondary | ICD-10-CM | POA: Diagnosis not present

## 2023-05-25 DIAGNOSIS — Z5181 Encounter for therapeutic drug level monitoring: Secondary | ICD-10-CM | POA: Diagnosis not present

## 2023-06-21 DIAGNOSIS — Z8551 Personal history of malignant neoplasm of bladder: Secondary | ICD-10-CM | POA: Diagnosis not present

## 2023-06-21 DIAGNOSIS — N35011 Post-traumatic bulbous urethral stricture: Secondary | ICD-10-CM | POA: Diagnosis not present

## 2023-10-07 DIAGNOSIS — D1801 Hemangioma of skin and subcutaneous tissue: Secondary | ICD-10-CM | POA: Diagnosis not present

## 2023-10-07 DIAGNOSIS — L738 Other specified follicular disorders: Secondary | ICD-10-CM | POA: Diagnosis not present

## 2023-10-07 DIAGNOSIS — L2089 Other atopic dermatitis: Secondary | ICD-10-CM | POA: Diagnosis not present

## 2023-10-07 DIAGNOSIS — L821 Other seborrheic keratosis: Secondary | ICD-10-CM | POA: Diagnosis not present

## 2023-10-07 DIAGNOSIS — L814 Other melanin hyperpigmentation: Secondary | ICD-10-CM | POA: Diagnosis not present

## 2023-10-19 DIAGNOSIS — E785 Hyperlipidemia, unspecified: Secondary | ICD-10-CM | POA: Diagnosis not present

## 2023-10-19 DIAGNOSIS — K219 Gastro-esophageal reflux disease without esophagitis: Secondary | ICD-10-CM | POA: Diagnosis not present

## 2023-10-19 DIAGNOSIS — F410 Panic disorder [episodic paroxysmal anxiety] without agoraphobia: Secondary | ICD-10-CM | POA: Diagnosis not present

## 2023-10-19 DIAGNOSIS — Z6827 Body mass index (BMI) 27.0-27.9, adult: Secondary | ICD-10-CM | POA: Diagnosis not present

## 2023-10-19 DIAGNOSIS — F411 Generalized anxiety disorder: Secondary | ICD-10-CM | POA: Diagnosis not present

## 2023-10-19 DIAGNOSIS — Z0000 Encounter for general adult medical examination without abnormal findings: Secondary | ICD-10-CM | POA: Diagnosis not present

## 2023-12-14 DIAGNOSIS — Z6827 Body mass index (BMI) 27.0-27.9, adult: Secondary | ICD-10-CM | POA: Diagnosis not present

## 2023-12-14 DIAGNOSIS — F410 Panic disorder [episodic paroxysmal anxiety] without agoraphobia: Secondary | ICD-10-CM | POA: Diagnosis not present

## 2023-12-14 DIAGNOSIS — F411 Generalized anxiety disorder: Secondary | ICD-10-CM | POA: Diagnosis not present

## 2024-01-29 DIAGNOSIS — Z23 Encounter for immunization: Secondary | ICD-10-CM | POA: Diagnosis not present
